# Patient Record
Sex: Female | Born: 1960 | Race: White | Hispanic: No | Marital: Married | Smoking: Never smoker
Health system: Southern US, Community
[De-identification: ages and names within clinical notes are randomized; demographics above are authoritative.]

## PROBLEM LIST (undated history)

## (undated) DIAGNOSIS — IMO0001 Reserved for inherently not codable concepts without codable children: Secondary | ICD-10-CM

## (undated) DIAGNOSIS — Z8719 Personal history of other diseases of the digestive system: Secondary | ICD-10-CM

## (undated) DIAGNOSIS — K219 Gastro-esophageal reflux disease without esophagitis: Secondary | ICD-10-CM

## (undated) DIAGNOSIS — F53 Postpartum depression: Secondary | ICD-10-CM

## (undated) DIAGNOSIS — Z90710 Acquired absence of both cervix and uterus: Secondary | ICD-10-CM

## (undated) DIAGNOSIS — O99345 Other mental disorders complicating the puerperium: Secondary | ICD-10-CM

## (undated) DIAGNOSIS — G43909 Migraine, unspecified, not intractable, without status migrainosus: Secondary | ICD-10-CM

## (undated) DIAGNOSIS — Z9889 Other specified postprocedural states: Secondary | ICD-10-CM

## (undated) HISTORY — DX: Migraine, unspecified, not intractable, without status migrainosus: G43.909

## (undated) HISTORY — PX: ABDOMINAL HYSTERECTOMY: SHX81

## (undated) HISTORY — DX: Gastro-esophageal reflux disease without esophagitis: K21.9

## (undated) HISTORY — DX: Other specified postprocedural states: Z98.890

## (undated) HISTORY — DX: Personal history of other diseases of the digestive system: Z87.19

## (undated) HISTORY — PX: EYE SURGERY: SHX253

## (undated) HISTORY — PX: OTHER SURGICAL HISTORY: SHX169

## (undated) HISTORY — PX: HERNIA REPAIR: SHX51

## (undated) HISTORY — DX: Postpartum depression: F53.0

## (undated) HISTORY — DX: Acquired absence of both cervix and uterus: Z90.710

## (undated) HISTORY — DX: Other mental disorders complicating the puerperium: O99.345

## (undated) HISTORY — DX: Reserved for inherently not codable concepts without codable children: IMO0001

---

## 2000-09-28 ENCOUNTER — Emergency Department (HOSPITAL_COMMUNITY): Admission: EM | Admit: 2000-09-28 | Discharge: 2000-09-28 | Payer: Self-pay | Admitting: Emergency Medicine

## 2000-09-28 ENCOUNTER — Encounter: Payer: Self-pay | Admitting: Emergency Medicine

## 2000-11-19 ENCOUNTER — Other Ambulatory Visit: Admission: RE | Admit: 2000-11-19 | Discharge: 2000-11-19 | Payer: Self-pay | Admitting: Family Medicine

## 2004-03-17 HISTORY — PX: UMBILICAL HERNIA REPAIR: SHX196

## 2004-09-11 ENCOUNTER — Other Ambulatory Visit: Admission: RE | Admit: 2004-09-11 | Discharge: 2004-09-11 | Payer: Self-pay | Admitting: Obstetrics and Gynecology

## 2005-02-21 ENCOUNTER — Ambulatory Visit (HOSPITAL_COMMUNITY): Admission: RE | Admit: 2005-02-21 | Discharge: 2005-02-21 | Payer: Self-pay

## 2005-02-21 ENCOUNTER — Ambulatory Visit (HOSPITAL_BASED_OUTPATIENT_CLINIC_OR_DEPARTMENT_OTHER): Admission: RE | Admit: 2005-02-21 | Discharge: 2005-02-21 | Payer: Self-pay

## 2005-02-21 ENCOUNTER — Encounter (INDEPENDENT_AMBULATORY_CARE_PROVIDER_SITE_OTHER): Payer: Self-pay | Admitting: Specialist

## 2005-03-17 DIAGNOSIS — Z8719 Personal history of other diseases of the digestive system: Secondary | ICD-10-CM

## 2005-03-17 HISTORY — DX: Personal history of other diseases of the digestive system: Z87.19

## 2006-04-24 ENCOUNTER — Encounter: Admission: RE | Admit: 2006-04-24 | Discharge: 2006-04-24 | Payer: Self-pay | Admitting: Obstetrics and Gynecology

## 2007-05-03 ENCOUNTER — Encounter: Admission: RE | Admit: 2007-05-03 | Discharge: 2007-05-03 | Payer: Self-pay | Admitting: Obstetrics and Gynecology

## 2007-05-28 ENCOUNTER — Ambulatory Visit: Payer: Self-pay | Admitting: Family Medicine

## 2007-05-28 DIAGNOSIS — F325 Major depressive disorder, single episode, in full remission: Secondary | ICD-10-CM | POA: Insufficient documentation

## 2007-05-28 DIAGNOSIS — K219 Gastro-esophageal reflux disease without esophagitis: Secondary | ICD-10-CM | POA: Insufficient documentation

## 2007-05-28 DIAGNOSIS — F329 Major depressive disorder, single episode, unspecified: Secondary | ICD-10-CM

## 2007-05-28 DIAGNOSIS — G43009 Migraine without aura, not intractable, without status migrainosus: Secondary | ICD-10-CM

## 2007-08-18 ENCOUNTER — Ambulatory Visit: Payer: Self-pay | Admitting: Family Medicine

## 2007-08-20 LAB — CONVERTED CEMR LAB
AST: 15 units/L (ref 0–37)
Albumin: 3.7 g/dL (ref 3.5–5.2)
BUN: 8 mg/dL (ref 6–23)
Chloride: 114 meq/L — ABNORMAL HIGH (ref 96–112)
Cholesterol: 142 mg/dL (ref 0–200)
Creatinine, Ser: 0.8 mg/dL (ref 0.4–1.2)
GFR calc Af Amer: 100 mL/min
GFR calc non Af Amer: 82 mL/min
LDL Cholesterol: 86 mg/dL (ref 0–99)
Total Protein: 6.3 g/dL (ref 6.0–8.3)
Triglycerides: 46 mg/dL (ref 0–149)
VLDL: 9 mg/dL (ref 0–40)

## 2007-08-23 ENCOUNTER — Ambulatory Visit: Payer: Self-pay | Admitting: Family Medicine

## 2007-09-07 ENCOUNTER — Ambulatory Visit: Payer: Self-pay | Admitting: Family Medicine

## 2007-09-07 LAB — CONVERTED CEMR LAB: Vit D, 1,25-Dihydroxy: 38 (ref 30–89)

## 2007-09-16 LAB — CONVERTED CEMR LAB
CO2: 27 meq/L (ref 19–32)
Calcium: 9.3 mg/dL (ref 8.4–10.5)
GFR calc non Af Amer: 96 mL/min
Sodium: 139 meq/L (ref 135–145)

## 2008-02-15 ENCOUNTER — Ambulatory Visit: Payer: Self-pay | Admitting: Family Medicine

## 2008-02-18 ENCOUNTER — Encounter: Payer: Self-pay | Admitting: Family Medicine

## 2008-02-21 LAB — CONVERTED CEMR LAB
ALT: 10 units/L (ref 0–35)
AST: 16 units/L (ref 0–37)
Albumin: 3.6 g/dL (ref 3.5–5.2)
Alkaline Phosphatase: 38 units/L — ABNORMAL LOW (ref 39–117)
BUN: 9 mg/dL (ref 6–23)
Bilirubin, Direct: 0.1 mg/dL (ref 0.0–0.3)
CO2: 30 meq/L (ref 19–32)
Chloride: 105 meq/L (ref 96–112)
Eosinophils Relative: 1.3 % (ref 0.0–5.0)
Glucose, Bld: 78 mg/dL (ref 70–99)
HCT: 33.7 % — ABNORMAL LOW (ref 36.0–46.0)
Lymphocytes Relative: 19.3 % (ref 12.0–46.0)
Monocytes Absolute: 0.4 10*3/uL (ref 0.1–1.0)
Monocytes Relative: 5.8 % (ref 3.0–12.0)
Neutrophils Relative %: 73.5 % (ref 43.0–77.0)
Platelets: 303 10*3/uL (ref 150–400)
Potassium: 3.6 meq/L (ref 3.5–5.1)
Total Protein: 6.7 g/dL (ref 6.0–8.3)
WBC: 6.1 10*3/uL (ref 4.5–10.5)

## 2008-12-06 ENCOUNTER — Ambulatory Visit: Payer: Self-pay | Admitting: Family Medicine

## 2008-12-08 ENCOUNTER — Telehealth: Payer: Self-pay | Admitting: Family Medicine

## 2008-12-11 ENCOUNTER — Ambulatory Visit: Payer: Self-pay | Admitting: Family Medicine

## 2009-07-19 ENCOUNTER — Ambulatory Visit: Payer: Self-pay | Admitting: Family Medicine

## 2009-07-19 DIAGNOSIS — R252 Cramp and spasm: Secondary | ICD-10-CM | POA: Insufficient documentation

## 2009-07-19 DIAGNOSIS — G4700 Insomnia, unspecified: Secondary | ICD-10-CM

## 2009-07-20 LAB — CONVERTED CEMR LAB
Chloride: 106 meq/L (ref 96–112)
Creatinine, Ser: 0.6 mg/dL (ref 0.4–1.2)
Iron: 11 ug/dL — ABNORMAL LOW (ref 42–145)
Potassium: 4.1 meq/L (ref 3.5–5.1)
Saturation Ratios: 2.7 % — ABNORMAL LOW (ref 20.0–50.0)
Transferrin: 288.1 mg/dL (ref 212.0–360.0)

## 2009-11-20 ENCOUNTER — Ambulatory Visit: Payer: Self-pay | Admitting: Family Medicine

## 2009-11-20 ENCOUNTER — Telehealth (INDEPENDENT_AMBULATORY_CARE_PROVIDER_SITE_OTHER): Payer: Self-pay | Admitting: *Deleted

## 2009-11-20 LAB — CONVERTED CEMR LAB
AST: 14 units/L (ref 0–37)
Alkaline Phosphatase: 45 units/L (ref 39–117)
Basophils Absolute: 0 10*3/uL (ref 0.0–0.1)
Bilirubin, Direct: 0.1 mg/dL (ref 0.0–0.3)
Calcium: 8.6 mg/dL (ref 8.4–10.5)
Eosinophils Relative: 1.7 % (ref 0.0–5.0)
Ferritin: 3.2 ng/mL — ABNORMAL LOW (ref 10.0–291.0)
GFR calc non Af Amer: 118.19 mL/min (ref >60)
Glucose, Bld: 91 mg/dL (ref 70–99)
HCT: 32.7 % — ABNORMAL LOW (ref 36.0–46.0)
HDL: 43.9 mg/dL (ref >39.00)
Iron: 22 ug/dL — ABNORMAL LOW (ref 42–145)
LDL Cholesterol: 119 mg/dL — ABNORMAL HIGH (ref 0–99)
Lymphs Abs: 1.2 10*3/uL (ref 0.7–4.0)
MCV: 77.3 fL — ABNORMAL LOW (ref 78.0–100.0)
Monocytes Absolute: 0.4 10*3/uL (ref 0.1–1.0)
Neutrophils Relative %: 71.1 % (ref 43.0–77.0)
Platelets: 329 10*3/uL (ref 150.0–400.0)
Potassium: 4.3 meq/L (ref 3.5–5.1)
RDW: 16.9 % — ABNORMAL HIGH (ref 11.5–14.6)
Sodium: 141 meq/L (ref 135–145)
Total Bilirubin: 0.4 mg/dL (ref 0.3–1.2)
Total CHOL/HDL Ratio: 4
Transferrin: 270.2 mg/dL (ref 212.0–360.0)
VLDL: 6.6 mg/dL (ref 0.0–40.0)
WBC: 6 10*3/uL (ref 4.5–10.5)

## 2009-11-23 ENCOUNTER — Other Ambulatory Visit: Admission: RE | Admit: 2009-11-23 | Discharge: 2009-11-23 | Payer: Self-pay | Admitting: Family Medicine

## 2009-11-23 ENCOUNTER — Ambulatory Visit: Payer: Self-pay | Admitting: Family Medicine

## 2009-11-23 DIAGNOSIS — R6882 Decreased libido: Secondary | ICD-10-CM | POA: Insufficient documentation

## 2009-11-23 DIAGNOSIS — D509 Iron deficiency anemia, unspecified: Secondary | ICD-10-CM | POA: Insufficient documentation

## 2009-11-23 DIAGNOSIS — N920 Excessive and frequent menstruation with regular cycle: Secondary | ICD-10-CM | POA: Insufficient documentation

## 2009-11-23 LAB — HM PAP SMEAR

## 2009-11-26 LAB — CONVERTED CEMR LAB
FSH: 9.4 milliintl units/mL
LH: 4.1 milliintl units/mL

## 2009-11-30 ENCOUNTER — Encounter (INDEPENDENT_AMBULATORY_CARE_PROVIDER_SITE_OTHER): Payer: Self-pay | Admitting: *Deleted

## 2009-12-05 ENCOUNTER — Encounter: Admission: RE | Admit: 2009-12-05 | Discharge: 2009-12-05 | Payer: Self-pay | Admitting: Family Medicine

## 2010-01-25 ENCOUNTER — Encounter: Admission: RE | Admit: 2010-01-25 | Discharge: 2010-01-25 | Payer: Self-pay | Admitting: Family Medicine

## 2010-01-25 DIAGNOSIS — D259 Leiomyoma of uterus, unspecified: Secondary | ICD-10-CM | POA: Insufficient documentation

## 2010-02-18 ENCOUNTER — Encounter: Payer: Self-pay | Admitting: Family Medicine

## 2010-04-16 NOTE — Letter (Signed)
Summary: Results Follow up Letter  [place] [facility]  [address] [place], [place] [address]   Phone: [phone]  Fax: [phone]    11/30/2009 MRN: [id number]     [facility] [address] [place], [place]  [address]    Dear Ms. Siebel,  The following are the results of your recent test(s):  Test         Result    Pap Smear:        Normal __x___  Not Normal _____ Comments:Repeat in 1 year ______________________________________________________ Cholesterol: LDL(Bad cholesterol):         Your goal is less than:         HDL (Good cholesterol):       Your goal is more than: Comments:  ______________________________________________________ Mammogram:        Normal _____  Not Normal _____ Comments:  ___________________________________________________________________ Hemoccult:        Normal _____  Not normal _______ Comments:    _____________________________________________________________________ Other Tests:    We routinely do not discuss normal results over the telephone.  If you desire a copy of the results, or you have any questions about this information we can discuss them at your next office visit.   Sincerely,   Kerby Nora MD

## 2010-04-16 NOTE — Assessment & Plan Note (Signed)
Summary: FATIGUE,LACK OF SLEEP/DLO   Vital Signs:  Patient profile:   50 year old female Weight:      144.50 pounds Temp:     98.4 degrees F oral Pulse rate:   76 / minute Pulse rhythm:   regular BP sitting:   112 / 68  (left arm) Cuff size:   regular  Vitals Entered By: Sydell Axon LPN (Jul 19, 1608 12:26 PM) CC: Insomnia for several months and leg cramps at night   History of Present Illness: "I cannot sleep." for several months. No unusual event and no real problems to preclude sleep. Just began having trouble and it has accelerated. Her mother was recently diagnose with thyroid disease. She has then developed leg cramps about 2-3 weeks ago,  no matter what  activity she has or has not done  in the day. She has not changfed her routine or her intake. She has a pretty set schedule and diet.  Problems Prior to Update: 1)  Cerumen Impaction, Right  (ICD-380.4) 2)  Vaginitis, Candidal  (ICD-112.1) 3)  Epigastric Pain  (ICD-789.06) 4)  Routine Gynecological Examination  (ICD-V72.31) 5)  Hypocalcemia  (ICD-275.41) 6)  Health Maintenance Exam  (ICD-V70.0) 7)  Screening For Lipoid Disorders  (ICD-V77.91) 8)  Common Migraine  (ICD-346.10) 9)  Gerd  (ICD-530.81) 10)  Depression  (ICD-311)  Medications Prior to Update: 1)  Acidophilus Probiotic Blend   Caps (Misc Intestinal Flora Regulat) .... Take 1 Tablet By Mouth Once A Day 2)  Prilosec Otc 20 Mg Tbec (Omeprazole Magnesium) .... One A Day 3)  Diflucan 150 Mg Tabs (Fluconazole) .Marland Kitchen.. 1 Tab By Mouth Every 72 Hours For 3 Doses  Allergies: 1)  ! * Penicillin 2)  ! * Macrodantin 3)  ! * Pyridium 4)  ! * Cipro 5)  ! * Septra  Physical Exam  General:  Well-developed,well-nourished,in no acute distress; alert,appropriate and cooperative throughout examination Ears:  left Tm clear...right TM occluded with wax, slight erythema of ear canal...after ear irrigation.Marland KitchenMarland KitchenTM appears clear and pts symptoms are resolved.  Nose:  External nasal  examination shows no deformity or inflammation. Nasal mucosa are pink and moist without lesions or exudates. Mouth:  MMM, no exudate, no oropharyngeal irritation Neck:  no carotid bruit or thyromegaly no cervical or supraclavicular lymphadenopathy  Lungs:  Normal respiratory effort, chest expands symmetrically. Lungs are clear to auscultation, no crackles or wheezes. Heart:  Normal rate and regular rhythm. S1 and S2 normal without gallop, murmur, click, rub or other extra sounds. Abdomen:  Bowel sounds positive,abdomen soft and non-tender without masses, organomegaly or hernias noted. Msk:  No deformity or scoliosis noted of thoracic or lumbar spine.   Extremities:  No clubbing, cyanosis, edema, or deformity noted with normal full range of motion of all joints.  No musc wasting noted. Neurologic:  No cranial nerve deficits noted. Station and gait are normal. DTRs are symmetrical throughout. Sensory, motor and coordinative functions appear intact. Skin:  Intact without suspicious lesions or rashes   Impression & Recommendations:  Problem # 1:  INSOMNIA (ICD-780.52) Assessment New  Discussed sleep hygiene and the approach of the latest journal article. Try to institute those she isn't already doing.  Try Melatonin 3 mg at bedtime, try for one week then taper. Do not use more than 3 mos at a time without medication holiday. Orders: TLB-TSH (Thyroid Stimulating Hormone) (84443-TSH)  Discussed sleep hygiene.   Problem # 2:  LEG CRAMPS, NOCTURNAL (ICD-729.82) Assessment: New Try Jogging  in a Jug, as directed. Will check labs. Orders: TLB-TSH (Thyroid Stimulating Hormone) (84443-TSH) TLB-B12 + Folate Pnl (16109_60454-U98/JXB) TLB-IBC Pnl (Iron/FE;Transferrin) (83550-IBC) TLB-BMP (Basic Metabolic Panel-BMET) (80048-METABOL)  Complete Medication List: 1)  Ibuprofen 200 Mg Tabs (Ibuprofen) .... As needed 2)  Excedrin Migraine 250-250-65 Mg Tabs (Aspirin-acetaminophen-caffeine) .... As  needed  Patient Instructions: 1)  Try Melatonin, 3mg  at night for one week and then slowly taper. 2)  Use sleep cycle hygiene. 3)  Google Jogging in a jug and try regularly for cramping  Current Allergies (reviewed today): ! * PENICILLIN ! * MACRODANTIN ! * PYRIDIUM ! * CIPRO ! * SEPTRA

## 2010-04-16 NOTE — Progress Notes (Signed)
----   Converted from flag ---- ---- 11/17/2009 11:43 PM, Kerby Nora MD wrote: total iron, ferritin, transferrin, cbc, CMET, lipids Dx 729.82, v77.91  ---- 11/15/2009 10:27 AM, Liane Comber CMA (AAMA) wrote: Peri Jefferson Morning! This pt is scheduled for cpx labs Tuesday, which labs to draw and dx codes to use? Thanks Tasha ------------------------------

## 2010-04-16 NOTE — Assessment & Plan Note (Signed)
Summary: CPX/CLE   Vital Signs:  Patient profile:   50 year old female Height:      65 inches Weight:      143 pounds BMI:     23.88 Temp:     99.3 degrees F oral Pulse rate:   88 / minute Pulse rhythm:   regular BP sitting:   110 / 68  (left arm) Cuff size:   regular  Vitals Entered By: Lowella Petties CMA (November 23, 2009 3:00 PM) CC: Check up   History of Present Illness: The patient is here for annual wellness exam and preventative care.    She has the below other ongoing issues  Insomnia...improved with supplements, melatonin  initially..then stopped..now when restarted thing..no effect.   Anemia, poor control: Likely due to DUB  Iron deficiency causing leg cramps/restless legs...improved some but remains low on iron 325 mg daily  Leg cramps have improved.   Menses are very  heavy... 3-4 days changing pads every 2 hours  Mothers family went through early menopause..mother had surgical menopause.  As mammogram scheduled upcoming.   Problems Prior to Update: 1)  Libido, Decreased  (ICD-799.81) 2)  Anemia, Iron Deficiency  (ICD-280.9) 3)  Menorrhagia  (ICD-626.2) 4)  Leg Cramps, Nocturnal  (ICD-729.82) 5)  Insomnia  (ICD-780.52) 6)  Routine Gynecological Examination  (ICD-V72.31) 7)  Health Maintenance Exam  (ICD-V70.0) 8)  Screening For Lipoid Disorders  (ICD-V77.91) 9)  Common Migraine  (ICD-346.10) 10)  Gerd  (ICD-530.81) 11)  Depression  (ICD-311)  Current Medications (verified): 1)  Ibuprofen 200 Mg Tabs (Ibuprofen) .... As Needed 2)  Excedrin Migraine 250-250-65 Mg Tabs (Aspirin-Acetaminophen-Caffeine) .... As Needed  Allergies (verified): 1)  ! * Penicillin 2)  ! * Macrodantin 3)  ! * Pyridium 4)  ! * Cipro 5)  ! * Septra  Past History:  Past medical, surgical, family and social histories (including risk factors) reviewed, and no changes noted (except as noted below).  Past Medical History: Reviewed history from 05/28/2007 and no changes  required. 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 Umbilical hernia in 2006 (no complications) Vaginal births 65 and 1998  Family History: Reviewed history from 05/28/2007 and no changes required. Father: Alive, MI at 7, tripple bypass surgery, hypercholesteremia Mother: Alive, fibromyalgia, HTN, hypercholesteremia, osteoporosis Brother: Alive, healthy  Children: 2 sons (youngest with asthma and seasonal allergies)  Social History: Reviewed history from 05/28/2007 and no changes required. Married (25 years), husband is healthy. 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    Never smoked. No alcohol or drug use.  Exercise 1-2 times a week (10-30 minutes) and outdoor activities in the spring and summer.   Healthy diet with fruits and vegetables.    Review of Systems General:  Denies fatigue and fever. CV:  Denies chest pain or discomfort. Resp:  Denies shortness of breath. GI:  Denies abdominal pain. GU:  Denies dysuria. Psych:  Denies anxiety and depression; decreased sex drive hot alot but no hot flashes.  Physical Exam  General:  Well-developed,well-nourished,in no acute distress; alert,appropriate and cooperative throughout examination Ears:  External ear exam shows no significant lesions or deformities.  Otoscopic examination reveals clear canals, tympanic membranes are intact bilaterally without bulging, retraction, inflammation or discharge. Hearing is grossly normal bilaterally. Nose:  External nasal examination shows no deformity or inflammation. Nasal mucosa are pink and moist without lesions  or exudates. Mouth:  Oral mucosa and oropharynx without lesions or exudates.  Teeth in good repair. Neck:  no carotid bruit or thyromegaly no cervical or supraclavicular lymphadenopathy  Lungs:  Normal  respiratory effort, chest expands symmetrically. Lungs are clear to auscultation, no crackles or wheezes. Heart:  Normal rate and regular rhythm. S1 and S2 normal without gallop, murmur, click, rub or other extra sounds. Abdomen:  Bowel sounds positive,abdomen soft and non-tender without masses, organomegaly or hernias noted. Genitalia:  Pelvic Exam:        External: normal female genitalia without lesions or masses        Vagina: normal without lesions or masses        Cervix: normal without lesions or masses        Adnexa: normal bimanual exam without masses or fullness        Uterus: fullness..? fibroid, small        Pap smear: performed Msk:  No deformity or scoliosis noted of thoracic or lumbar spine.   Pulses:  R and L posterior tibial pulses are full and equal bilaterally  Extremities:  no edema Skin:  Intact without suspicious lesions or rashes Psych:  Cognition and judgment appear intact. Alert and cooperative with normal attention span and concentration. No apparent delusions, illusions, hallucinations   Impression & Recommendations:  Problem # 1:  HEALTH MAINTENANCE EXAM (ICD-V70.0) The patient's preventative maintenance and recommended screening tests for an annual wellness exam were reviewed in full today. Brought up to date unless services declined.  Counselled on the importance of diet, exercise, and its role in overall health and mortality. The patient's FH and SH was reviewed, including their home life, tobacco status, and drug and alcohol status.     Problem # 2:  ROUTINE GYNECOLOGICAL EXAMINATION (ICD-V72.31) PAP pending  Problem # 3:  MENORRHAGIA (ICD-626.2) ? due to perimenopause. ? small fibroid palpated. Eval further with US pelvic. Eval FSH and LH...no bleeding issue such as low paltelets seen on cbc. Nml TSH. May need hormonal treatment versus GYN referral.  Orders: Radiology Referral (Radiology) Specimen Handling (52841) T-FSH [phone]) T-LH  [phone])  Problem # 4:  LEG CRAMPS, NOCTURNAL (ICD-729.82) resolved.   Problem # 5:  ANEMIA, IRON DEFICIENCY (ICD-280.9) Due to DUB.  Restart daily to two times a day iron . Her updated medication list for this problem includes:    Ferrous Sulfate 325 (65 Fe) Mg Tbec (Ferrous sulfate) .Marland Kitchen... Take 1 tablet by mouth once a day  Problem # 6:  INSOMNIA (ICD-780.52)  Melatonin not effective second time.  ? due to perimenopause Reviewed sleep hygeine. Trial of ambien..helped her in the past.  Her updated medication list for this problem includes:    Zolpidem Tartrate 10 Mg Tabs (Zolpidem tartrate) .Marland Kitchen... 1/2 to 1 tab by mouth at bedtime as needed insomnia  Problem # 7:  LIBIDO, DECREASED (ICD-799.81) Denies depression. ? due to perimenopause  Problem # 8:  COMMON MIGRAINE (ICD-346.10) Sumatriptan trial for seve migraine...use OTC meds for mild to moderate. Occuring once a monthwith menses.  Her updated medication list for this problem includes:    Ibuprofen 200 Mg Tabs (Ibuprofen) .Marland Kitchen... As needed    Excedrin Migraine 250-250-65 Mg Tabs (Aspirin-acetaminophen-caffeine) .Marland Kitchen... As needed    Sumatriptan Succinate 100 Mg Tabs (Sumatriptan succinate) .Marland Kitchen... 1 tab by mouth x 1 may repeat in 2 hours if not relief. max 200 mg in 24 hours.  Complete Medication List: 1)  Ibuprofen 200 Mg Tabs (Ibuprofen) .Marland KitchenMarland KitchenMarland Kitchen  As needed 2)  Excedrin Migraine 250-250-65 Mg Tabs (Aspirin-acetaminophen-caffeine) .... As needed 3)  Ferrous Sulfate 325 (65 Fe) Mg Tbec (Ferrous sulfate) .... Take 1 tablet by mouth once a day 4)  Sumatriptan Succinate 100 Mg Tabs (Sumatriptan succinate) .Marland Kitchen.. 1 tab by mouth x 1 may repeat in 2 hours if not relief. max 200 mg in 24 hours. 5)  Zolpidem Tartrate 10 Mg Tabs (Zolpidem tartrate) .... 1/2 to 1 tab by mouth at bedtime as needed insomnia  Other Orders: Admin 1st Vaccine (16109) Flu Vaccine 55yrs + (60454)  Patient Instructions: 1)  Referral Appointment Information 2)   Day/Date: 3)  Time: 4)  Place/MD: 5)  Address: 6)  Phone/Fax: 7)  Patient given appointment information. Information/Orders faxed/mailed.  8)  Retart iron tablet.  9)  Increase exercise further. 10)  Try sumatriptan for migraines, severe. 11)  Limit ambien use but may use for insomnia ..taper use. Not long term med.  69)  We will call with lab and [place] results.  Prescriptions: ZOLPIDEM TARTRATE 10 MG TABS (ZOLPIDEM TARTRATE) 1/2 to 1 tab by mouth at bedtime as needed insomnia  #30 x 0   Entered and Authorized by:   Kerby Nora MD   Signed by:   Kerby Nora MD on 11/23/2009   Method used:   Print then Give to Patient   RxID:   [id number] SUMATRIPTAN SUCCINATE 100 MG TABS (SUMATRIPTAN SUCCINATE) 1 tab by mouth x 1 may repeat in 2 hours if not relief. MAx 200 mg in 24 hours.  #9 x 0   Entered and Authorized by:   Kerby Nora MD   Signed by:   Kerby Nora MD on 11/23/2009   Method used:   Electronically to        [facility]  [address] [phone]* (retail)       [address]       [place], [place]  [address]       Ph: [phone]       Fax: [phone]   RxID:   [id number]   Prior Medications (reviewed today): IBUPROFEN 200 MG TABS (IBUPROFEN) as needed EXCEDRIN MIGRAINE 250-250-65 MG TABS (ASPIRIN-ACETAMINOPHEN-CAFFEINE) as needed Current Allergies (reviewed today): ! * PENICILLIN ! * MACRODANTIN ! * PYRIDIUM ! * CIPRO ! * SEPTRA  Flu Vaccine Consent Questions     Do you have a history of severe allergic reactions to this vaccine? no    Any prior history of allergic reactions to egg and/or gelatin? no    Do you have a sensitivity to the preservative Thimersol? no    Do you have a past history of Guillan-Barre Syndrome? no    Do you currently have an acute febrile illness? no    Have you ever had a severe reaction to latex? no    Vaccine information given and explained to patient? yes    Are you currently pregnant? no    Lot Number:[id number]    Exp Date:09/14/2010   Site Given  Left Deltoid IM Last Flu Vaccine:  Historical (12/16/2006 10:50:31 AM) Flu Vaccine Result Date:  11/16/2009 Flu Vaccine Result:  given Flu Vaccine Next Due:  1 yr   .lbflu

## 2010-04-18 NOTE — Consult Note (Signed)
Summary: [facility]   Imported By: Lanelle Bal 03/02/2010 09:59:49  _____________________________________________________________________  External Attachment:    Type:   Image     Comment:   External Document

## 2010-04-24 ENCOUNTER — Encounter (HOSPITAL_COMMUNITY)
Admission: RE | Admit: 2010-04-24 | Discharge: 2010-04-24 | Disposition: A | Discharge status: Home / Self Care | Payer: 59 | Source: Ambulatory Visit | Attending: Obstetrics and Gynecology | Admitting: Obstetrics and Gynecology

## 2010-04-24 DIAGNOSIS — Z01812 Encounter for preprocedural laboratory examination: Secondary | ICD-10-CM | POA: Insufficient documentation

## 2010-04-24 LAB — COMPREHENSIVE METABOLIC PANEL
Albumin: 3.8 g/dL (ref 3.5–5.2)
BUN: 6 mg/dL (ref 6–23)
Calcium: 8.8 mg/dL (ref 8.4–10.5)
Chloride: 105 mEq/L (ref 96–112)
Creatinine, Ser: 0.59 mg/dL (ref 0.4–1.2)
GFR calc Af Amer: >60 mL/min (ref >60)
Total Bilirubin: 0.5 mg/dL (ref 0.3–1.2)

## 2010-04-24 LAB — CBC
MCH: 25.1 pg — ABNORMAL LOW (ref 26.0–34.0)
MCHC: 31.1 g/dL (ref 30.0–36.0)
MCV: 80.7 fL (ref 78.0–100.0)
Platelets: 299 10*3/uL (ref 150–400)
RBC: 4.46 MIL/uL (ref 3.87–5.11)
RDW: 16.1 % — ABNORMAL HIGH (ref 11.5–15.5)

## 2010-04-24 LAB — SURGICAL PCR SCREEN: MRSA, PCR: NEGATIVE

## 2010-04-30 ENCOUNTER — Ambulatory Visit (HOSPITAL_COMMUNITY)
Admission: RE | Admit: 2010-04-30 | Discharge: 2010-05-01 | Disposition: A | Discharge status: Home / Self Care | Payer: 59 | Source: Ambulatory Visit | Attending: Obstetrics and Gynecology | Admitting: Obstetrics and Gynecology

## 2010-04-30 ENCOUNTER — Other Ambulatory Visit: Payer: Self-pay | Admitting: Obstetrics and Gynecology

## 2010-04-30 DIAGNOSIS — N840 Polyp of corpus uteri: Secondary | ICD-10-CM | POA: Insufficient documentation

## 2010-04-30 DIAGNOSIS — N80109 Endometriosis of ovary, unspecified side, unspecified depth: Secondary | ICD-10-CM | POA: Insufficient documentation

## 2010-04-30 DIAGNOSIS — N801 Endometriosis of ovary: Secondary | ICD-10-CM | POA: Insufficient documentation

## 2010-04-30 DIAGNOSIS — D250 Submucous leiomyoma of uterus: Secondary | ICD-10-CM | POA: Insufficient documentation

## 2010-04-30 DIAGNOSIS — N949 Unspecified condition associated with female genital organs and menstrual cycle: Secondary | ICD-10-CM | POA: Insufficient documentation

## 2010-04-30 DIAGNOSIS — D251 Intramural leiomyoma of uterus: Secondary | ICD-10-CM | POA: Insufficient documentation

## 2010-04-30 DIAGNOSIS — N83209 Unspecified ovarian cyst, unspecified side: Secondary | ICD-10-CM | POA: Insufficient documentation

## 2010-04-30 DIAGNOSIS — N938 Other specified abnormal uterine and vaginal bleeding: Secondary | ICD-10-CM | POA: Insufficient documentation

## 2010-04-30 NOTE — H&P (Addendum)
  NAMEBRILYNN, [name]                ACCOUNT NO.:  [id number]  MEDICAL RECORD NO.:  [id number]          PATIENT TYPE:  AMB  LOCATION:  SDC                           FACILITY:  [facility]  PHYSICIAN:  Dineen Kid. Rana Snare, M.D.    DATE OF BIRTH:  1960-04-05  DATE OF ADMISSION:  04/30/2010 DATE OF DISCHARGE:                             HISTORY & PHYSICAL   She is to be admitted tomorrow, April 30, 2010, to [facility] for hysterectomy.  HISTORY OF PRESENT ILLNESS:  Ms. Connie [name] is a 50 year old G2, P2 who has been having worsening problems with vaginal bleeding and pelvic pressure.  Ultrasound evaluation showed multiple fibroids including intracavitary fibroid.  She also has a right ovarian mass suspicious for an endometrioma.  She has no further childbearing desires.  Husband has had a vasectomy.  She presents for definitive surgical intervention and requests hysterectomy, also planned to remove the right tube and ovary.  PAST MEDICAL HISTORY:  Significant for migraines.  PAST SURGICAL HISTORY:  She has had 2 vaginal births and an umbilical hernia repair.  She has allergies to PENICILLIN, CIPRO, SEPTRA, MACRODANTIN, and PYRIDIUM, however, she can take cephalosporins.  MEDICATIONS:  She takes Imitrex 50 mg as needed and Ambien 5-10 mg as needed at bedtime for sleep.  PHYSICAL EXAMINATION:  VITAL SIGNS:  Her blood pressure 110/70, weight is 140 pounds. HEART:  Regular rate and rhythm. LUNGS:  Clear to auscultation bilaterally. ABDOMEN:  Soft, distended, nontender. PELVIC:  Uterus is anteverted, mobile, 8-10-week size.  Mass in the right adnexa, measuring approximately 5-6 cm in size.  IMPRESSION AND PLAN:  Menorrhagia, fibroids, pelvic pain, right ovarian complex cyst, suspicion for endometrioma.  Planned hysterectomy.  We are going to attempt laparoscopic-assisted vaginal hysterectomy with removal of right tube and ovary.  She does wish preservation of the left and we discussed  possibility having removed that.  She does give informed consent to remove the left ovary as a secondary needs to be removed. Discussed the possibility of laparoscopically assisted hysterectomy versus laparotomy because of her previous hernia repair.  We have to evaluate that with a 5-mm scope, looking upwards at the umbilical site to see whether or not we can proceed through the umbilicus.  If it appears there is too much scar tissue, if it is not appeared safe to proceed laparoscopically, we will do this by laparotomy.  I discussed the risks and benefits of procedure at length which include but not limited to risk of infection, bleeding, damage to uterus, tubes, and bowel and bladder, risks associated blood transfusion, risks associated with anesthesia.  She does give her informed consent and wished to proceed.     Dineen Kid Rana Snare, M.D.     DCL/MEDQ  D:  04/29/2010  T:  04/30/2010  Job:  [id number]  Electronically Signed by Candice Camp M.D. on 04/30/2010 01:01:30 PM

## 2010-05-01 LAB — CBC
HCT: 25.6 % — ABNORMAL LOW (ref 36.0–46.0)
Platelets: 282 10*3/uL (ref 150–400)
RBC: 3.1 MIL/uL — ABNORMAL LOW (ref 3.87–5.11)
RDW: 16.1 % — ABNORMAL HIGH (ref 11.5–15.5)
WBC: 10.8 10*3/uL — ABNORMAL HIGH (ref 4.0–10.5)

## 2010-05-13 NOTE — Discharge Summary (Addendum)
  Connie [name], Connie [name]                ACCOUNT NO.:  [id number]  MEDICAL RECORD NO.:  [id number]           PATIENT TYPE:  LOCATION:                                 FACILITY:  PHYSICIAN:  Dineen Kid. Rana Snare, M.D.    DATE OF BIRTH:  [birth year]  DATE OF ADMISSION:  04/30/2010 DATE OF DISCHARGE:  05/01/2010                              DISCHARGE SUMMARY   HISTORY OF PRESENT ILLNESS:  Connie [name] is a 50 year old G2 P2 with worsening problems with pelvic pressure, abnormal uterine bleeding, fibroids, and right ovarian cyst which is suspicious for endometrioma. She presented for definitive surgical intervention, planned laparoscopic- assisted vaginal hysterectomy with right salpingo-oophorectomy.  HOSPITAL COURSE:  The patient underwent a laparoscopic-assisted vaginal hysterectomy and salpingo-oophorectomy.  The procedure was uncomplicated.  Estimated blood loss was 300 mL.  Findings at the time of surgery were right ovarian cyst which was consistent with endometrioma and enlarged 8-10 week size uterus consistent with fibroids.  Her postoperative care was relatively unremarkable.  She had good return of bowel function and ambulation.  Her postop hemoglobin on postop day #1 was 8.0 but she was able to tolerate a regular diet without difficulty.  The pain was well controlled on ibuprofen and oxycodone.  Incisions were clean, dry, and intact with normoactive bowel sounds.  The patient desired discharge  to home.  On postop day #1, she remained afebrile throughout her hospital course albeit having both blood pressures in the 80s to 90s over 50s but normal pulse in the 70s to 90s.  DISPOSITION:  The patient will be discharged home.  Follow up in the office in 2 weeks and with routine instruction sheet for hysterectomy, told to return for increased pain, fever, or bleeding.  She is going to restart her on iron therapy for anemia and she has prescription for oxycodone.     Dineen Kid Rana Snare,  M.D.     DCL/MEDQ  D:  05/01/2010  T:  05/01/2010  Job:  [id number]  Electronically Signed by Candice Camp M.D. on 05/13/2010 08:06:11 AM

## 2010-05-13 NOTE — Op Note (Addendum)
Connie [name], [name]                ACCOUNT NO.:  [id number]  MEDICAL RECORD NO.:  [id number]           PATIENT TYPE:  I  LOCATION:  9318                          FACILITY:  [facility]  PHYSICIAN:  Dineen Kid. Rana Snare, M.D.    DATE OF BIRTH:  [birth year]  DATE OF PROCEDURE:  04/30/2010 DATE OF DISCHARGE:                              OPERATIVE REPORT   PREOPERATIVE DIAGNOSES:  Pelvic pain, abnormal uterine bleeding, uterine leiomyoma and right ovarian cyst.  POSTOPERATIVE DIAGNOSES:  Pelvic pain, abnormal uterine bleeding, uterine leiomyoma and right ovarian cyst.  PROCEDURE:  Laparoscopic-assisted vaginal hysterectomy and right salpingo-oophorectomy.  SURGEON:  Dineen Kid. Rana Snare, MD  ASSISTANT:  Freddy Finner, MD  ANESTHESIA:  General endotracheal.  INDICATIONS:  Connie [name] is a 50 year old G2, P2 with worsening pelvic pressure, right ovarian cyst consistent with endometrioma approximately 5.5-cm in size, uterine fibroids with abnormal bleeding excessive in her menstrual.  She desires to definitive surgical intervention and requests hysterectomy, presents for LAVH and RSO.  The risks and benefits of procedure were discussed at length, which include but are not limited to risk of infection, bleeding, damage to bowel, bladder, ureters, ovary. Risks associated with anesthesia.  Risks associated with blood transfusion.  She gives her informed consent and wished to proceed.  FINDINGS AT THE TIME OF SURGERY:  An enlarged 10-week size uterus in retroverted position consistent with fibroids, right ovarian cyst consistent with endometrioma, left ovary and tube appeared to be normal. The appendix was not visualized due to be on retrocecal location, normal- appearing gallbladder and liver.  The previous umbilical herniorrhaphy was without evidence of adhesions.  DESCRIPTION OF PROCEDURE:  After adequate analgesia, the patient was placed in the dorsal lithotomy position.  She was sterilely prepped  and draped.  The bladder was sterilely drained.  Hulka tenaculum was placed on the cervix.  A 5-mm incision was made to the left of midline two fingerbreadths above the pubic symphysis.  A Veress needle was inserted. The abdomen was insufflated with dullness to percussion.  A 5-mm trocar was inserted.  A 5-mm camera was inserted looking at the previous umbilical incision.  No evidence of adhesions were noted.  A 1-cm infraumbilical skin incision was then made through the umbilicus.  The trocar was inserted.  Reexamination revealed good placement noted.  The abdomen was reinsufflated.  The legs were repositioned and after careful systematic evaluation of the abdomen and pelvis, the gyrus cutting forceps were used to dissect along the left broad ligament, ligating and dissecting across the round ligament.  The utero-ovarian ligament was down to the inferior portion of the broad ligament with the left tube and ovary falling laterally.  Good hemostasis was achieved and carried to avoid the underlying ureter.  The right ovarian mass was pulled to the midline and the infundibulopelvic ligament was ligated with gyrus and dissected across the infundibulopelvic ligament across the rest of the broad ligament and the round ligament with the right tube and ovary falling to the midline.  Good hemostasis had been achieved.  The bladder was then elevated.  The uterovesical junction was noted and  a bladder flap was created.  The abdomen was then desufflated.  The legs were repositioned.  A weighted speculum was placed in the vagina.  Posterior colpotomy was then performed.  The cervix was then circumscribed with Bovie cautery.  LigaSure instrument was used to ligate across the uterosacral ligaments bilaterally with the uterine vessels bilaterally with the cardinal ligaments bilaterally and the bladder pillars.  The anterior vaginal mucosa was freed from the anterior cervical fascia, dissected anteriorly  until the peritoneum was entered on the anterior surface with the Deaver retractor was placed underneath the bladder. LigaSure instrument was then used to ligate across the uterine vasculature bilaterally and the inferior portions of the broad ligaments with good hemostasis achieved.  The uterus was then removed with the right tube and ovary intact.  A small packing was placed.  The uterosacral ligaments were identified, ligated with suture of 0 Monocryl suture.  The posterior peritoneum was then closed in pursestring fashion with 0 Monocryl suture.  The vagina was then closed in a vertical fashion using figure-of-eights of 0 Monocryl suture with good approximation noted.  The packing was then removed and the remaining portion of the vagina was then closed with good support noted.  The uterosacral ligaments were plicated in the midline.  Good support and good hemostasis was noted.  The bladder was then drained with a Foley catheter with clear yellow urine noted.  The legs were repositioned. The abdomen was reinsufflated.  Nezhat suction irrigator was used to irrigate the abdomen and pelvis.  Small peritoneal bleeders were coagulated with bipolar cautery and careful systematic evaluation of the right infundibulopelvic ligament revealed good hemostasis.  Good peristalsis to the ureter both on the right and left.  The left utero- ovarian ligament was good hemostasis.  The cul-de-sac similarly was hemostatic.  The abdomen was then desufflated.  Trocars were removed. The infraumbilical skin incision was closed with a 0 Ethibond permanent suture in the fascia and mesh with good approximation noted.  The umbilical skin incision was then closed with two interrupted sutures of 3-0 Vicryl Rapide.  The 5-mm site was closed with 3-0 Vicryl Rapide subcuticular suture, and Dermabond.  The incisions were injected with 0.25% Marcaine, total of 10 mL used.  The patient stable and transferred to the  recovery room.  Sponge and instrument count was normal x3. Estimated blood loss 300 mL.  The patient received 1 g of cefotetan preoperatively.     Dineen Kid Rana Snare, M.D.     DCL/MEDQ  D:  04/30/2010  T:  05/01/2010  Job:  [id number]  Electronically Signed by Candice Camp M.D. on 05/13/2010 08:06:13 AM

## 2010-08-02 ENCOUNTER — Telehealth: Payer: Self-pay | Admitting: *Deleted

## 2010-08-02 NOTE — Op Note (Signed)
Connie [name], Connie [name]                ACCOUNT NO.:  [id number]   MEDICAL RECORD NO.:  [id number]          PATIENT TYPE:  AMB   LOCATION:  [facility]                         FACILITY:  [facility]   PHYSICIAN:  Lebron Conners, M.D.   DATE OF BIRTH:  1960-10-17   DATE OF PROCEDURE:  02/21/2005  DATE OF DISCHARGE:                                 OPERATIVE REPORT   PREOPERATIVE DIAGNOSIS:  Umbilical hernia with umbilical mass.   POSTOPERATIVE DIAGNOSIS:  Umbilical hernia with umbilical mass.   OPERATION:  Repair of umbilical hernia and biopsy of umbilical mass.   SURGEON:  Lebron Conners, M.D.   ANESTHESIA:  General and local.   DESCRIPTION OF PROCEDURE:  After the patient was monitored anesthetized with  general anesthesia by laryngeal mask airway technique and had routine  preparation and draping of the abdomen, I infiltrated local anesthetic  around the umbilicus. I made a 2 cm transverse infraumbilical incision and  then dissected up to the umbilical skin and separated the umbilical skin  from the subcutaneous tissues. There was no obvious hernia at first until I  dissected down to the fascia and as I separated the umbilical skin from the  area of the fascia it was obvious that there was either omentum or  preperitoneal fat which was incarcerated up in the umbilical area. I divided  that with the cautery and then dissected the subcutaneous tissues away from  the hernia defects for about 2 cm in all directions. There was a mass effect  in the umbilicus and some darkening of the umbilical skin and so I dissected  up into that and got out some brown liquid which appeared to be hemorrhagic  fluid. Because of this unusual appearance, I took a biopsy of the tissues  and sent that as a specimen even though there did not appear to be any  actual tumor present. I then closed the defect in the fascia with running 2-  0 Prolene. I cut a patch of polypropylene mesh about 3 cm around and sewed  that in  place over the repair with a running basting 2-0 Prolene suture. I  felt I had an excellent repair. I sutured the umbilical skin and the  subcutaneous tissues down to the central part of the mesh to reduce the dead  space. I closed the skin with running intracuticular 4-0 Vicryl and Steri-  Strips. The patient was stable throughout the procedure.      Lebron Conners, M.D.  Electronically Signed     WB/MEDQ  D:  02/21/2005  T:  02/21/2005  Job:  [id number]

## 2010-08-02 NOTE — Telephone Encounter (Signed)
Patient advised and she cant get off work to come today but, will try to get an appt next week

## 2010-08-02 NOTE — Telephone Encounter (Signed)
Pt is complaining of a yeast infection- has itching, burning, curdy discharge.  She is requesting that diflucan be called to [address], she says it usually takes more than one treatment for her to get over yeast infections.

## 2010-08-02 NOTE — Telephone Encounter (Signed)
Unless she has been on recent antibiotoics...she needs appt before yeast medication can be sent in. Can add her on today if she is able.

## 2010-08-03 ENCOUNTER — Ambulatory Visit (INDEPENDENT_AMBULATORY_CARE_PROVIDER_SITE_OTHER): Payer: 59 | Admitting: Family Medicine

## 2010-08-03 VITALS — BP 108/68 | HR 84 | Temp 97.9°F | Wt 142.0 lb

## 2010-08-03 DIAGNOSIS — N760 Acute vaginitis: Secondary | ICD-10-CM

## 2010-08-03 DIAGNOSIS — L293 Anogenital pruritus, unspecified: Secondary | ICD-10-CM

## 2010-08-03 MED ORDER — FLUCONAZOLE 150 MG PO TABS: ORAL_TABLET | ORAL | Status: DC

## 2010-08-03 NOTE — Progress Notes (Signed)
  Subjective:    Patient ID: Connie [name], female    DOB: Jul 24, 1960, 49 y.o.   MRN: [id number]  HPI Patient seen with some vaginal itching and mild swelling past couple of weeks. Similar symptoms with yeast vaginitis in the past. She had a hysterectomy back in February. No complications since then. Did have one yeast infection following that. No recent antibiotic use. No history of diabetes. No stool changes. No history of douching. She's had topical pain with over-the-counter antifungal such as Monistat and Gyne-Lotrimin. Has tolerated Diflucan the past.   Review of Systems  Constitutional: Negative for fever and chills.  Gastrointestinal: Negative for abdominal pain.  Genitourinary: Negative for dysuria, flank pain, vaginal bleeding, vaginal discharge and pelvic pain.       Objective:   Physical Exam  Constitutional: She appears well-developed and well-nourished. No distress.  HENT:  Mouth/Throat: Oropharynx is clear and moist.  Cardiovascular: Normal rate, regular rhythm and normal heart sounds.   Pulmonary/Chest: Effort normal and breath sounds normal. She has no wheezes. She has no rales.          Assessment & Plan:  Probable yeast vaginitis. Diflucan 150 mg and followup with primary if not resolving over the next few days

## 2010-08-03 NOTE — Patient Instructions (Signed)
Followup with primary physician if symptoms not clearing over the next week

## 2010-08-04 ENCOUNTER — Encounter: Payer: Self-pay | Admitting: Family Medicine

## 2010-11-22 ENCOUNTER — Encounter: Payer: Self-pay | Admitting: Family Medicine

## 2010-11-22 ENCOUNTER — Ambulatory Visit (INDEPENDENT_AMBULATORY_CARE_PROVIDER_SITE_OTHER): Payer: 59 | Admitting: Family Medicine

## 2010-11-22 DIAGNOSIS — H6120 Impacted cerumen, unspecified ear: Secondary | ICD-10-CM

## 2010-11-22 NOTE — Patient Instructions (Signed)
Take care and let us know if you have concerns.

## 2010-11-22 NOTE — Assessment & Plan Note (Signed)
Treated and tolerated well.  Feels better.  F/u prn.

## 2010-11-22 NOTE — Progress Notes (Signed)
Can't hear from R ear, gradual loss over last few weeks.  R ear is stopped up.  Allergies are worse recently.  L ear feels fine except for minimal stuffiness.  H/o cerumen impaction.  No FCNAV.  Nonsmoker.   Meds, vitals, and allergies reviewed.   ROS: See HPI.  Otherwise, noncontributory.  nad ncat R cerumen impaction resolved with irrigation, canal and TM wnl on recheck L TM and canal wnl Nasal and OP exam wnl Neck supple

## 2011-02-26 ENCOUNTER — Encounter: Payer: Self-pay | Admitting: Family Medicine

## 2011-02-26 ENCOUNTER — Ambulatory Visit (INDEPENDENT_AMBULATORY_CARE_PROVIDER_SITE_OTHER): Payer: 59 | Admitting: Family Medicine

## 2011-02-26 VITALS — BP 120/60 | HR 115 | Temp 98.2°F | Ht 65.0 in | Wt 148.8 lb

## 2011-02-26 DIAGNOSIS — N760 Acute vaginitis: Secondary | ICD-10-CM

## 2011-02-26 DIAGNOSIS — Z23 Encounter for immunization: Secondary | ICD-10-CM

## 2011-02-26 DIAGNOSIS — R82998 Other abnormal findings in urine: Secondary | ICD-10-CM

## 2011-02-26 DIAGNOSIS — R8290 Unspecified abnormal findings in urine: Secondary | ICD-10-CM

## 2011-02-26 LAB — POCT URINALYSIS DIPSTICK
Leukocytes, UA: NEGATIVE
Nitrite, UA: NEGATIVE
Protein, UA: NEGATIVE
pH, UA: 6

## 2011-02-26 MED ORDER — SUMATRIPTAN SUCCINATE 100 MG PO TABS: 100.0000 mg | ORAL_TABLET | ORAL | Status: DC | PRN

## 2011-02-26 MED ORDER — FLUCONAZOLE 150 MG PO TABS: ORAL_TABLET | ORAL | Status: DC

## 2011-02-26 NOTE — Progress Notes (Signed)
  Patient Name: Connie [name] Date of Birth: 06-19-1960 Age: 50 y.o. Medical Record Number: [id number] Gender: female  History of Present Illness:  Connie [name] is a 50 y.o. very pleasant female patient who presents with the following:  Pulse 80 now  Feeling like maybe a urinary tract infection, has a sweet smell. Having some itchy external discharge.   No possible STD.   Yeast infection+  Past Medical History, Surgical History, Social History, Family History, and Problem List have been reviewed in EHR and updated if relevant.  Review of Systems: ROS: GEN: Acute illness details above GI: Tolerating PO intake GU: maintaining adequate hydration and urination Pulm: No SOB Interactive and getting along well at home.  Otherwise, ROS is as per the HPI.   Physical Examination: Filed Vitals:   02/26/11 1023  BP: 120/60  Pulse: 115  Temp: 98.2 F (36.8 C)  TempSrc: Oral  Height: 5\' 5"  (1.651 m)  Weight: 148 lb 12.8 oz (67.495 kg)  SpO2: 99%    Body mass index is 24.76 kg/(m^2).   GEN: WDWN, NAD, Non-toxic, Alert & Oriented x 3 HEENT: Atraumatic, Normocephalic.  Ears and Nose: No external deformity. EXTR: No clubbing/cyanosis/edema NEURO: Normal gait.  PSYCH: Normally interactive. Conversant. Not depressed or anxious appearing.  Calm demeanor.  GU: mildly irritated externally with mildly reddish labia Abd: s, nt, nd, +bs  Assessment and Plan: Candidal vaginitis:  + scant yeast on wet prep with no trich or clue cells ua neg

## 2011-03-18 DIAGNOSIS — Z90710 Acquired absence of both cervix and uterus: Secondary | ICD-10-CM

## 2011-03-18 HISTORY — DX: Acquired absence of both cervix and uterus: Z90.710

## 2011-12-09 ENCOUNTER — Ambulatory Visit (INDEPENDENT_AMBULATORY_CARE_PROVIDER_SITE_OTHER): Payer: 59 | Admitting: Family Medicine

## 2011-12-09 ENCOUNTER — Encounter: Payer: Self-pay | Admitting: Family Medicine

## 2011-12-09 VITALS — BP 130/88 | HR 94 | Temp 98.1°F | Ht 65.5 in | Wt 150.2 lb

## 2011-12-09 DIAGNOSIS — R21 Rash and other nonspecific skin eruption: Secondary | ICD-10-CM

## 2011-12-09 MED ORDER — TRIAMCINOLONE ACETONIDE 0.5 % EX CREA: TOPICAL_CREAM | Freq: Two times a day (BID) | CUTANEOUS | Status: DC

## 2011-12-09 NOTE — Assessment & Plan Note (Signed)
Possible photosensitivity but not on any meds that would cause. No clear exposures but rash is most consistent with a contact dermatitis.  treat with topical triamcinolone cream.  Call if not improving.

## 2011-12-09 NOTE — Progress Notes (Signed)
  Subjective:    Patient ID: Connie [name], female    DOB: 1960/06/22, 51 y.o.   MRN: [id number]  Rash This is a new problem. The current episode started yesterday. The problem has been gradually worsening since onset. The affected locations include the face, neck and chest. The rash is characterized by burning, redness and itchiness. She was exposed to nothing (No new exposures, no new meds). Associated symptoms include a sore throat. Pertinent negatives include no congestion, cough, fatigue, fever, rhinorrhea or shortness of breath. Past treatments include anti-itch cream (benadryl by mouth). The treatment provided no relief. There is no history of allergies, asthma, eczema or varicella.      Review of Systems  Constitutional: Negative for fever and fatigue.  HENT: Positive for sore throat. Negative for congestion and rhinorrhea.   Respiratory: Negative for cough and shortness of breath.   Skin: Positive for rash.       Objective:   Physical Exam  Constitutional: She is oriented to person, place, and time. She appears well-developed and well-nourished.  HENT:  Head: Normocephalic.  Right Ear: External ear normal.  Left Ear: External ear normal.  Eyes: Conjunctivae normal are normal. Pupils are equal, round, and reactive to light.  Neck: Normal range of motion. Neck supple. No thyromegaly present.  Cardiovascular: Normal rate and regular rhythm.   Pulmonary/Chest: Effort normal and breath sounds normal.  Neurological: She is alert and oriented to person, place, and time.  Skin:       Erythema heat in chest and neck, some on face, blanching, no excoriations.          Assessment & Plan:

## 2011-12-09 NOTE — Patient Instructions (Addendum)
Apply steroid cream to rash. Use benadryl for itching  at night and zyrtec during the day.  Call if not improving in next 72 hours.  Go to ER if diffiuclty swallowing or breathing.

## 2013-10-24 ENCOUNTER — Encounter: Payer: Self-pay | Admitting: Family Medicine

## 2013-10-24 ENCOUNTER — Ambulatory Visit (INDEPENDENT_AMBULATORY_CARE_PROVIDER_SITE_OTHER): Payer: 59 | Admitting: Family Medicine

## 2013-10-24 VITALS — BP 92/70 | HR 70 | Temp 98.2°F | Ht 65.35 in | Wt 145.5 lb

## 2013-10-24 DIAGNOSIS — G43009 Migraine without aura, not intractable, without status migrainosus: Secondary | ICD-10-CM

## 2013-10-24 MED ORDER — SUMATRIPTAN SUCCINATE 100 MG PO TABS: ORAL_TABLET | ORAL | Status: DC

## 2013-10-24 NOTE — Patient Instructions (Signed)
Riboflavin --- can help. ? dose

## 2013-10-24 NOTE — Progress Notes (Signed)
[place] [address]                Phone: [phone]                  Fax: [phone] 10/24/2013  ID: Connie [name]   MRN: [id number]  DOB: 1960/07/25  Primary Physician:  Eliezer Lofts, MD  Chief Complaint: Migraine  Subjective:   History of Present Illness:  Connie [name] is a 53 y.o. very pleasant female patient who presents with the following:  Migraines 1-2 a week in the last month.  Before then only every few months.   Sitting in bright room now, NAD. Overall felt ok. Failure OTC meds  Past Medical History, Surgical History, Social History, Family History, Problem List, Medications, and Allergies have been reviewed and updated if relevant.  Review of Systems:  GEN: No acute illnesses, no fevers, chills. GI: No n/v/d, eating normally Pulm: No SOB Interactive and getting along well at home.  Otherwise, ROS is as per the HPI.  Objective:   Physical Examination: BP 92/70  Pulse 70  Temp(Src) 98.2 F (36.8 C) (Oral)  Ht 5' 5.35" (1.66 m)  Wt 145 lb 8 oz (65.998 kg)  BMI 23.95 kg/m2   GEN: WDWN, NAD, Non-toxic, A & O x 3 HEENT: Atraumatic, Normocephalic. Neck supple. No masses, No LAD. Ears and Nose: No external deformity. TM clear. CV: RRR, No M/G/R. No JVD. No thrill. No extra heart sounds. PULM: CTA B, no wheezes, crackles, rhonchi. No retractions. No resp. distress. No accessory muscle use. EXTR: No c/c/e NEURO Normal gait.  PSYCH: Normally interactive. Conversant. Not depressed or anxious appearing.  Calm demeanor.   Laboratory and Imaging Data:  Assessment & Plan:   COMMON MIGRAINE  Mild, refill imitrex.rest  New Prescriptions   No medications on file   Discontinued Medications   ASPIRIN-ACETAMINOPHEN-CAFFEINE (EXCEDRIN MIGRAINE) 250-250-65 MG PER TABLET    Take 1 tablet by mouth as needed.     FLUCONAZOLE (DIFLUCAN) 150 MG TABLET    Take 1 and repeat q 3 days   TRIAMCINOLONE CREAM (KENALOG) 0.5 %     Apply topically 2 (two) times daily.   ZOLPIDEM (AMBIEN) 10 MG TABLET    Take 5-10 mg by mouth at bedtime as needed.     Modified Medications   Modified Medication Previous Medication   SUMATRIPTAN (IMITREX) 100 MG TABLET SUMAtriptan (IMITREX) 100 MG tablet      MAX 200 mg in 24 hours    Take 1 tablet (100 mg total) by mouth every 2 (two) hours as needed. MAX 200 mg in 24 hours   No orders of the defined types were placed in this encounter.   Follow-up: No Follow-up on file. Unless noted above, the patient is to follow-up if symptoms worsen. Red flags were reviewed with the patient.  Signed,  Maud Deed. Lydian Chavous, MD, [facility]  Current Medications at Discharge:   Medication List       This list is accurate as of: 10/24/13 10:28 AM.  Always use your most recent med list.               diphenhydrAMINE 25 MG tablet  Commonly known as:  BENADRYL  Take 25 mg by mouth every 6 (six) hours as needed.     ibuprofen 200 MG  tablet  Commonly known as:  ADVIL,MOTRIN  Take 200 mg by mouth as needed.     SUMAtriptan 100 MG tablet  Commonly known as:  IMITREX  MAX 200 mg in 24 hours

## 2013-10-24 NOTE — Progress Notes (Signed)
Pre visit review using our clinic review tool, if applicable. No additional management support is needed unless otherwise documented below in the visit note. 

## 2013-11-08 ENCOUNTER — Other Ambulatory Visit: Payer: Self-pay | Admitting: Obstetrics and Gynecology

## 2013-11-09 LAB — CYTOLOGY - PAP

## 2014-10-09 ENCOUNTER — Telehealth: Payer: Self-pay

## 2014-10-09 NOTE — Telephone Encounter (Signed)
Called and spoke with patient, and notified them that they were due for a Mammogram. Patient states that she already had a Mammogram at [facility] this past August, and that she scheduled an appointment for August of this year since her recommendations were to come back for one once a year. Patient states that the results came back normal.

## 2014-10-13 ENCOUNTER — Encounter: Payer: Self-pay | Admitting: *Deleted

## 2014-10-13 NOTE — Progress Notes (Signed)
Pt stated had mammogram @[facility] August 2015 (telephone note 10/09/14)

## 2014-10-27 ENCOUNTER — Other Ambulatory Visit: Payer: Self-pay | Admitting: Family Medicine

## 2014-11-24 ENCOUNTER — Ambulatory Visit (INDEPENDENT_AMBULATORY_CARE_PROVIDER_SITE_OTHER): Payer: 59 | Admitting: Family Medicine

## 2014-11-24 ENCOUNTER — Encounter: Payer: Self-pay | Admitting: Family Medicine

## 2014-11-24 VITALS — BP 110/80 | HR 81 | Temp 97.8°F | Ht 65.0 in | Wt 152.2 lb

## 2014-11-24 DIAGNOSIS — G43009 Migraine without aura, not intractable, without status migrainosus: Secondary | ICD-10-CM | POA: Diagnosis not present

## 2014-11-24 MED ORDER — SUMATRIPTAN SUCCINATE 100 MG PO TABS: ORAL_TABLET | ORAL | Status: DC

## 2014-11-24 MED ORDER — TOPIRAMATE 25 MG PO CPSP: 25.0000 mg | ORAL_CAPSULE | Freq: Every day | ORAL | Status: DC

## 2014-11-24 NOTE — Patient Instructions (Signed)
Start topiramate at bedtime.  Use imitrex with ibuprofen 800 mg for severe headache.  Keep migraine diary.

## 2014-11-24 NOTE — Progress Notes (Signed)
Pre visit review using our clinic review tool, if applicable. No additional management support is needed unless otherwise documented below in the visit note. 

## 2014-11-24 NOTE — Progress Notes (Signed)
SUBJECTIVE:  Connie [name] is a 54 y.o. female who complains of migraine headache history for 18 year(s). She has a well established history of recurrent migraines.  Seeing GYN for CPX with labs.  Description of pain: squeezing pain, unilateral in the bilateral temporal area. Associated symptoms: vomiting. Imitrex work well for her in past.. More recently  Helps only temporarily.  Usually 1 per month in past, now in last 2 months has had 3-4 a month.  No clear trigger other than weather, heat, air pressure.     Current Outpatient Prescriptions  Medication Sig Dispense Refill  . ibuprofen (ADVIL,MOTRIN) 200 MG tablet Take 200 mg by mouth as needed.      . SUMAtriptan (IMITREX) 100 MG tablet MAX 2 TABLETS BY MOUTH IN 24 HOURS 10 tablet 0   No current facility-administered medications for this visit.    There are no associated abnormal neurological symptoms such as TIA's, loss of balance, loss of vision or speech, numbness or weakness on review. Past neurological history: negative for stroke, MS, epilepsy, or brain tumor.   OBJECTIVE:  Patient appears in no current pain. Her vitals are normal. Alert and oriented x 3.  Ears and throat normal. Neck fully supple without nodes. Sinuses non tender. Cranial nerves are normal.  Fundi are normal with sharp disc margins, no papilledema, hemorrhages or exudates noted. DTR's normal and symmetric. Babinski sign absent.  Mental status normal. Cerebellar function normal.   CV: RR, no murmey  Pulm: LCTAB   ASSESSMENT:

## 2014-11-24 NOTE — Progress Notes (Deleted)
   Subjective:    Patient ID: Connie [name], female    DOB: 01-Oct-1960, 54 y.o.   MRN: [id number]  HPI    Review of Systems     Objective:   Physical Exam        Assessment & Plan:

## 2014-11-24 NOTE — Assessment & Plan Note (Signed)
Start topamax for prophylaxis. Usre imitrex along with ibuprofen to treat acute headache and to try to avoid returning headache.  Headache diary, follow up in 1 month. Info on triggers provided.

## 2014-12-15 ENCOUNTER — Telehealth: Payer: Self-pay

## 2014-12-15 NOTE — Telephone Encounter (Signed)
Great. No appt needed.

## 2014-12-15 NOTE — Telephone Encounter (Signed)
Pt left v/m; pt seen 11/24/14, pt thinks she can cb with update about migraines rather than scheduling appt in one month from 11/24/14 visit; topiramate is helping migraines; taking topiramate daily was too strong and pt is taking topiramate every other day and pt is doing well; pt has had 2 migraines since 11/24/14. The imitrex followed by ibuprofen 800 mg as instructed by Dr Diona Browner was successful in getting rid of migraine. Call pt if needed. Also as FYI due to ins coverage pt is changing to mail order pharmacy and [facility] will request refills when needed.

## 2014-12-18 ENCOUNTER — Other Ambulatory Visit: Payer: Self-pay | Admitting: *Deleted

## 2014-12-18 MED ORDER — TOPIRAMATE 25 MG PO CPSP: 25.0000 mg | ORAL_CAPSULE | Freq: Every day | ORAL | Status: DC

## 2014-12-18 MED ORDER — SUMATRIPTAN SUCCINATE 100 MG PO TABS: ORAL_TABLET | ORAL | Status: DC

## 2014-12-18 NOTE — Addendum Note (Signed)
Addended by: Carter Kitten on: 12/18/2014 03:20 PM   Modules accepted: Orders

## 2014-12-19 NOTE — Addendum Note (Signed)
Addended by: Carter Kitten on: 12/19/2014 02:50 PM   Modules accepted: Orders, Medications

## 2015-02-20 ENCOUNTER — Other Ambulatory Visit: Payer: Self-pay | Admitting: Family Medicine

## 2015-03-13 ENCOUNTER — Other Ambulatory Visit: Payer: Self-pay | Admitting: Family Medicine

## 2015-03-30 ENCOUNTER — Encounter: Payer: Self-pay | Admitting: Family Medicine

## 2015-03-30 ENCOUNTER — Ambulatory Visit (INDEPENDENT_AMBULATORY_CARE_PROVIDER_SITE_OTHER): Payer: 59 | Admitting: Family Medicine

## 2015-03-30 VITALS — BP 112/78 | HR 84 | Temp 98.5°F | Ht 65.0 in | Wt 151.5 lb

## 2015-03-30 DIAGNOSIS — B09 Unspecified viral infection characterized by skin and mucous membrane lesions: Secondary | ICD-10-CM | POA: Insufficient documentation

## 2015-03-30 DIAGNOSIS — J069 Acute upper respiratory infection, unspecified: Secondary | ICD-10-CM | POA: Diagnosis not present

## 2015-03-30 DIAGNOSIS — J029 Acute pharyngitis, unspecified: Secondary | ICD-10-CM | POA: Diagnosis not present

## 2015-03-30 DIAGNOSIS — B9789 Other viral agents as the cause of diseases classified elsewhere: Secondary | ICD-10-CM

## 2015-03-30 LAB — POCT RAPID STREP A (OFFICE): Rapid Strep A Screen: NEGATIVE

## 2015-03-30 NOTE — Patient Instructions (Signed)
Mucinex DM twice daily for cough and congestion.  Ibuprofen 800 mg every 8 hours for sore throat.  Rest, fluids. Call if not improving in 7-10 days.

## 2015-03-30 NOTE — Progress Notes (Signed)
Pre visit review using our clinic review tool, if applicable. No additional management support is needed unless otherwise documented below in the visit note. 

## 2015-03-30 NOTE — Assessment & Plan Note (Signed)
Appears like viral rash instead of strep rash.

## 2015-03-30 NOTE — Assessment & Plan Note (Signed)
Neg strep.  Symtpomatic care.

## 2015-03-30 NOTE — Progress Notes (Signed)
   Subjective:    Patient ID: Connie [name], female    DOB: 1960-05-17, 55 y.o.   MRN: [id number]  Sore Throat  This is a new problem. The current episode started in the past 7 days (2 days). Neither side of throat is experiencing more pain than the other. There has been no fever. Associated symptoms include congestion, coughing, headaches, swollen glands and trouble swallowing. Pertinent negatives include no ear discharge, ear pain, hoarse voice, plugged ear sensation or shortness of breath. Associated symptoms comments: white pockets in throat , rash on chest.. She has had no exposure to strep or mono. She has tried acetaminophen for the symptoms. The treatment provided moderate relief.  Rash This is a new problem. The current episode started yesterday. Rash characteristics: not itchy. She was exposed to nothing. Associated symptoms include congestion and coughing. Pertinent negatives include no shortness of breath. Past treatments include nothing. The treatment provided no relief. There is no history of allergies, asthma, eczema or varicella.    Social History /Family History/Past Medical History reviewed and updated if needed.   Review of Systems  HENT: Positive for congestion and trouble swallowing. Negative for ear discharge, ear pain and hoarse voice.   Respiratory: Positive for cough. Negative for shortness of breath.   Skin: Positive for rash.  Neurological: Positive for headaches.       Objective:   Physical Exam  Constitutional: Vital signs are normal. She appears well-developed and well-nourished. She is cooperative.  Non-toxic appearance. She does not appear ill. No distress.  HENT:  Head: Normocephalic.  Right Ear: Hearing, tympanic membrane, external ear and ear canal normal. Tympanic membrane is not erythematous, not retracted and not bulging. No middle ear effusion.  Left Ear: Hearing, tympanic membrane, external ear and ear canal normal. Tympanic membrane is not  erythematous, not retracted and not bulging.  No middle ear effusion.  Nose: Mucosal edema and rhinorrhea present. Right sinus exhibits no maxillary sinus tenderness and no frontal sinus tenderness. Left sinus exhibits no maxillary sinus tenderness and no frontal sinus tenderness.  Mouth/Throat: Uvula is midline and mucous membranes are normal. Posterior oropharyngeal edema and posterior oropharyngeal erythema present. No oropharyngeal exudate.  Eyes: Conjunctivae, EOM and lids are normal. Pupils are equal, round, and reactive to light. Lids are everted and swept, no foreign bodies found.  Neck: Trachea normal and normal range of motion. Neck supple. Carotid bruit is not present. No thyroid mass and no thyromegaly present.  Cardiovascular: Normal rate, regular rhythm, S1 normal, S2 normal, normal heart sounds, intact distal pulses and normal pulses.  Exam reveals no gallop and no friction rub.   No murmur heard. Pulmonary/Chest: Effort normal and breath sounds normal. No tachypnea. No respiratory distress. She has no decreased breath sounds. She has no wheezes. She has no rhonchi. She has no rales.  Abdominal: Soft. Normal appearance and bowel sounds are normal. There is no tenderness.  Neurological: She is alert.  Skin: Skin is warm, dry and intact. Rash noted.     Patchy red macules on chest.. Not typical of strep rash.   Psychiatric: Her speech is normal and behavior is normal. Judgment and thought content normal. Her mood appears not anxious. Cognition and memory are normal. She does not exhibit a depressed mood.          Assessment & Plan:

## 2015-04-20 ENCOUNTER — Emergency Department
Admission: EM | Admit: 2015-04-20 | Discharge: 2015-04-20 | Disposition: A | Discharge status: Home / Self Care | Payer: 59 | Attending: Emergency Medicine | Admitting: Emergency Medicine

## 2015-04-20 ENCOUNTER — Telehealth: Payer: Self-pay | Admitting: Family Medicine

## 2015-04-20 ENCOUNTER — Encounter: Payer: Self-pay | Admitting: *Deleted

## 2015-04-20 DIAGNOSIS — Z79899 Other long term (current) drug therapy: Secondary | ICD-10-CM | POA: Insufficient documentation

## 2015-04-20 DIAGNOSIS — Z880 Allergy status to penicillin: Secondary | ICD-10-CM | POA: Diagnosis not present

## 2015-04-20 DIAGNOSIS — R51 Headache: Secondary | ICD-10-CM | POA: Diagnosis present

## 2015-04-20 DIAGNOSIS — G43709 Chronic migraine without aura, not intractable, without status migrainosus: Secondary | ICD-10-CM | POA: Insufficient documentation

## 2015-04-20 DIAGNOSIS — G43909 Migraine, unspecified, not intractable, without status migrainosus: Secondary | ICD-10-CM

## 2015-04-20 LAB — CBC
HEMATOCRIT: 45.1 % (ref 35.0–47.0)
HEMOGLOBIN: 15.2 g/dL (ref 12.0–16.0)
MCH: 30.2 pg (ref 26.0–34.0)
MCHC: 33.6 g/dL (ref 32.0–36.0)
MCV: 90 fL (ref 80.0–100.0)
Platelets: 283 10*3/uL (ref 150–440)
RBC: 5.01 MIL/uL (ref 3.80–5.20)
RDW: 12.8 % (ref 11.5–14.5)
WBC: 7.6 10*3/uL (ref 3.6–11.0)

## 2015-04-20 LAB — BASIC METABOLIC PANEL
ANION GAP: 9 (ref 5–15)
BUN: 12 mg/dL (ref 6–20)
CALCIUM: 9.2 mg/dL (ref 8.9–10.3)
CHLORIDE: 106 mmol/L (ref 101–111)
CO2: 25 mmol/L (ref 22–32)
Creatinine, Ser: 0.62 mg/dL (ref 0.44–1.00)
GFR calc non Af Amer: >60 mL/min (ref >60)
Glucose, Bld: 105 mg/dL — ABNORMAL HIGH (ref 65–99)
POTASSIUM: 3.7 mmol/L (ref 3.5–5.1)
Sodium: 140 mmol/L (ref 135–145)

## 2015-04-20 MED ORDER — SODIUM CHLORIDE 0.9 % IV BOLUS (SEPSIS)
1000.0000 mL | Freq: Once | INTRAVENOUS | Status: AC
  Administered 2015-04-20: 1000 mL via INTRAVENOUS

## 2015-04-20 MED ORDER — TRAMADOL HCL 50 MG PO TABS: 50.0000 mg | ORAL_TABLET | Freq: Four times a day (QID) | ORAL | Status: DC | PRN

## 2015-04-20 MED ORDER — DIPHENHYDRAMINE HCL 50 MG/ML IJ SOLN
25.0000 mg | Freq: Once | INTRAMUSCULAR | Status: AC
  Administered 2015-04-20: 25 mg via INTRAVENOUS
  Filled 2015-04-20: qty 1

## 2015-04-20 MED ORDER — METOCLOPRAMIDE HCL 10 MG PO TABS: 10.0000 mg | ORAL_TABLET | Freq: Three times a day (TID) | ORAL | Status: DC | PRN

## 2015-04-20 MED ORDER — METOCLOPRAMIDE HCL 5 MG/ML IJ SOLN
10.0000 mg | Freq: Once | INTRAMUSCULAR | Status: AC
  Administered 2015-04-20: 10 mg via INTRAVENOUS
  Filled 2015-04-20: qty 2

## 2015-04-20 MED ORDER — KETOROLAC TROMETHAMINE 30 MG/ML IJ SOLN
15.0000 mg | Freq: Once | INTRAMUSCULAR | Status: AC
  Administered 2015-04-20: 15 mg via INTRAVENOUS
  Filled 2015-04-20: qty 1

## 2015-04-20 NOTE — Telephone Encounter (Signed)
Patient Name: Connie [name] DOB: 1960-04-25 Initial Comment Caller states wife has a history of migraines- was on medication but had a bad rash from [facility] to know what to do Nurse Assessment Nurse: Vallery Sa, RN, [facility] [place] Date/Time (Eastern Time): 04/20/2015 9:30:42 AM Confirm and document reason for call. If symptomatic, describe symptoms. You must click the next button to save text entered. ---Caller states that Marche developed another Migraine yesterday. No fever. No injury in the past 3 days. Alert and responsive. Has the patient traveled out of the country within the last 30 days? ---No Does the patient have any new or worsening symptoms? ---Yes Will a triage be completed? ---Yes Related visit to physician within the last 2 weeks? ---No Does the PT have any chronic conditions? (i.e. diabetes, asthma, etc.) ---Yes List chronic conditions. ---Migraines (Rash reaction to previous Migraine medications) Did the patient indicate they were pregnant? ---No Is this a behavioral health or substance abuse call? ---No Guidelines Guideline Title Affirmed Question Affirmed Notes Headache Severe pain in one eye Final Disposition User Go to ED Now Vallery Sa, RN, [facility] - ED Disagree/Comply: Comply

## 2015-04-20 NOTE — ED Provider Notes (Signed)
Time Seen: Approximately 12:30  I have reviewed the triage notes  Chief Complaint: Migraine   History of Present Illness: Connie [name] is a 55 y.o. female who presents with a long history of migraine headaches. Patient normally takes Imitrex but her most recent dosage over the weekend she states she felt like she had an adverse effect with some nausea and numbness in her throat, shortness of breath, etc. The patient states the headache has now moved from the right to the left and this particular headache started yesterday afternoon. She states as far as the characteristics of her headache and seems to be consistent with previous migraines in the past. Patient denies any fever or trauma or neck discomfort. She states that she's had similar headaches before this may be one of the more intense migraines and she didn't want to take her Imitrex again. She describes some blurred vision which is again consistent with her previous migraines with some generalized weakness. Denies any focal weakness.  Past Medical History  Diagnosis Date  . Reflux   . Post partum depression 1986, 1998    W/both children - Tx with antidepressants for second child  . Migraines     Patient Active Problem List   Diagnosis Date Noted  . Viral URI with cough 03/30/2015  . Viral rash 03/30/2015  . FIBROIDS, UTERUS 01/25/2010  . ANEMIA, IRON DEFICIENCY 11/23/2009  . MENORRHAGIA 11/23/2009  . LIBIDO, DECREASED 11/23/2009  . LEG CRAMPS, NOCTURNAL 07/19/2009  . INSOMNIA 07/19/2009  . DEPRESSION 05/28/2007  . Migraine without aura 05/28/2007  . GERD 05/28/2007    Past Surgical History  Procedure Laterality Date  . Umbilical hernia repair  123456    No complications  . Vaginal births  1986, 1998    Past Surgical History  Procedure Laterality Date  . Umbilical hernia repair  123456    No complications  . Vaginal births  1986, 1998    Current Outpatient Rx  Name  Route  Sig  Dispense  Refill  . ibuprofen  (ADVIL,MOTRIN) 200 MG tablet   Oral   Take 200 mg by mouth as needed.           . SUMAtriptan (IMITREX) 100 MG tablet      Take as directed - may  repeat in 2 hours if  headache persists or recurs   10 tablet   2   . zolpidem (AMBIEN) 10 MG tablet   Oral   Take 10 mg by mouth at bedtime.      5     Allergies:  Ciprofloxacin; Estrogens; Nitrofurantoin; Penicillins; Phenazopyridine hcl; and Sulfamethoxazole-trimethoprim  Family History: Family History  Problem Relation Age of Onset  . Hyperlipidemia Mother   . Fibromyalgia Mother   . Osteoporosis Mother   . Hypertension Mother   . Hyperlipidemia Father   . Heart disease Father 28    MI, Tipple bypass surgery  . Asthma Son   . Allergies Son     Social History: Social History  Substance Use Topics  . Smoking status: Never Smoker   . Smokeless tobacco: Never Used  . Alcohol Use: No     Review of Systems:   10 point review of systems was performed and was otherwise negative:  Constitutional: No fever Eyes: No visual disturbances ENT: No sore throat, ear pain Cardiac: No chest pain Respiratory: No shortness of breath, wheezing, or stridor Abdomen: No abdominal pain, no vomiting, No diarrhea Endocrine: No weight loss, No night sweats  Extremities: No peripheral edema, cyanosis Skin: No rashes, easy bruising Neurologic: No focal weakness, trouble with speech or swollowing Urologic: No dysuria, Hematuria, or urinary frequency   Physical Exam:  ED Triage Vitals  Enc Vitals Group     BP 04/20/15 1046 135/96 mmHg     Pulse Rate 04/20/15 1046 78     Resp 04/20/15 1046 18     Temp 04/20/15 1046 98.3 F (36.8 C)     Temp Source 04/20/15 1046 Oral     SpO2 04/20/15 1046 99 %     Weight 04/20/15 1046 150 lb (68.04 kg)     Height 04/20/15 1046 5\' 5"  (1.651 m)     Head Cir --      Peak Flow --      Pain Score 04/20/15 1047 10     Pain Loc --      Pain Edu? --      Excl. in [place]? --     General: Awake ,  Alert , and Oriented times 3; GCS 15 Head: Normal cephalic , atraumatic Eyes: Pupils equal , round, reactive to light patient has bilateral photophobia Nose/Throat: No nasal drainage, patent upper airway without erythema or exudate.  Neck: Supple, Full range of motion, No anterior adenopathy or palpable thyroid masses Lungs: Clear to ascultation without wheezes , rhonchi, or rales Heart: Regular rate, regular rhythm without murmurs , gallops , or rubs Abdomen: Soft, non tender without rebound, guarding , or rigidity; bowel sounds positive and symmetric in all 4 quadrants. No organomegaly .        Extremities: 2 plus symmetric pulses. No edema, clubbing or cyanosis Neurologic: normal ambulation, Motor symmetric without deficits, sensory intact Skin: warm, dry, no rashes   Labs:   All laboratory work was reviewed including any pertinent negatives or positives listed below:  Labs Reviewed  CBC  BASIC METABOLIC PANEL        ED Course:  Differential diagnosis includes but is not exclusive to subarachnoid hemorrhage, meningitis, encephalitis, previous head trauma, cavernous venous thrombosis, muscle tension headache, temporal arteritis, migraine or migraine equivalent, etc.   Patient's stay here was uneventful and she was given an IV fluid bolus along with Reglan 10 mg IV, Benadryl 25 mg IV, and 15 mg of Toradol with symptomatic relief. States her headache essentially resolved and I felt that this was unlikely to be a life-threatening cause for headache such as a subarachnoid hemorrhage, etc. I felt imaging was not necessary at this time though advised her that if her headache persists that she may need further radiologic evaluation. She was referred back to her primary physician.  Assessment: Acute exacerbation of chronic migraine cephalgia      Plan:  Outpatient management Patient was advised to return immediately if condition worsens. Patient was advised to follow up with their  primary care physician or other specialized physicians involved in their outpatient care            Daymon Larsen, MD 04/20/15 [phone]

## 2015-04-20 NOTE — ED Notes (Signed)
AAOX3.  SKIN WARM AND DRY.  AMBULATES WITH EASY AND STEADY GAIT.  MOVING ALL EXTREMITIES EQUALLY AND STRONG.

## 2015-04-20 NOTE — ED Notes (Signed)
States migrane that began yesterday, states hx of migranes, says she has not taken anything for the migrane because she had a bad reaction to her imitrex recently, pt states nausea and photosensitivity with blurry vision

## 2015-04-20 NOTE — Discharge Instructions (Signed)
Migraine Headache °A migraine headache is an intense, throbbing pain on one or both sides of your head. A migraine can last for 30 minutes to several hours. °CAUSES  °The exact cause of a migraine headache is not always known. However, a migraine may be caused when nerves in the brain become irritated and release chemicals that cause inflammation. This causes pain. °Certain things may also trigger migraines, such as: °· Alcohol. °· Smoking. °· Stress. °· Menstruation. °· Aged cheeses. °· Foods or drinks that contain nitrates, glutamate, aspartame, or tyramine. °· Lack of sleep. °· Chocolate. °· Caffeine. °· Hunger. °· Physical exertion. °· Fatigue. °· Medicines used to treat chest pain (nitroglycerine), birth control pills, estrogen, and some blood pressure medicines. °SIGNS AND SYMPTOMS °· Pain on one or both sides of your head. °· Pulsating or throbbing pain. °· Severe pain that prevents daily activities. °· Pain that is aggravated by any physical activity. °· Nausea, vomiting, or both. °· Dizziness. °· Pain with exposure to bright lights, loud noises, or activity. °· General sensitivity to bright lights, loud noises, or smells. °Before you get a migraine, you may get warning signs that a migraine is coming (aura). An aura may include: °· Seeing flashing lights. °· Seeing bright spots, halos, or zigzag lines. °· Having tunnel vision or blurred vision. °· Having feelings of numbness or tingling. °· Having trouble talking. °· Having muscle weakness. °DIAGNOSIS  °A migraine headache is often diagnosed based on: °· Symptoms. °· Physical exam. °· A CT scan or MRI of your head. These imaging tests cannot diagnose migraines, but they can help rule out other causes of headaches. °TREATMENT °Medicines may be given for pain and nausea. Medicines can also be given to help prevent recurrent migraines.  °HOME CARE INSTRUCTIONS °· Only take over-the-counter or prescription medicines for pain or discomfort as directed by your  health care provider. The use of long-term narcotics is not recommended. °· Lie down in a dark, quiet room when you have a migraine. °· Keep a journal to find out what may trigger your migraine headaches. For example, write down: °¨ What you eat and drink. °¨ How much sleep you get. °¨ Any change to your diet or medicines. °· Limit alcohol consumption. °· Quit smoking if you smoke. °· Get 7-9 hours of sleep, or as recommended by your health care provider. °· Limit stress. °· Keep lights dim if bright lights bother you and make your migraines worse. °SEEK IMMEDIATE MEDICAL CARE IF:  °· Your migraine becomes severe. °· You have a fever. °· You have a stiff neck. °· You have vision loss. °· You have muscular weakness or loss of muscle control. °· You start losing your balance or have trouble walking. °· You feel faint or pass out. °· You have severe symptoms that are different from your first symptoms. °MAKE SURE YOU:  °· Understand these instructions. °· Will watch your condition. °· Will get help right away if you are not doing well or get worse. °  °This information is not intended to replace advice given to you by your health care provider. Make sure you discuss any questions you have with your health care provider. °  °Document Released: 03/03/2005 Document Revised: 03/24/2014 Document Reviewed: 11/08/2012 °Elsevier Interactive Patient Education ©2016 [employer] ° °Please return immediately if condition worsens. Please contact her primary physician or the physician you were given for referral. If you have any specialist physicians involved in her treatment and plan please also contact   them. Thank you for using [facility]. You can also take over-the-counter ibuprofen along with Benadryl.

## 2015-04-20 NOTE — ED Notes (Signed)
Pt given ice pack for head

## 2016-12-18 ENCOUNTER — Other Ambulatory Visit: Payer: Self-pay | Admitting: Obstetrics and Gynecology

## 2016-12-18 DIAGNOSIS — R928 Other abnormal and inconclusive findings on diagnostic imaging of breast: Secondary | ICD-10-CM

## 2016-12-18 LAB — HM PAP SMEAR: HM Pap smear: NEGATIVE

## 2016-12-24 ENCOUNTER — Ambulatory Visit
Admission: RE | Admit: 2016-12-24 | Discharge: 2016-12-24 | Disposition: A | Discharge status: Home / Self Care | Payer: 59 | Source: Ambulatory Visit | Attending: Obstetrics and Gynecology | Admitting: Obstetrics and Gynecology

## 2016-12-24 DIAGNOSIS — R928 Other abnormal and inconclusive findings on diagnostic imaging of breast: Secondary | ICD-10-CM

## 2017-01-15 ENCOUNTER — Ambulatory Visit (INDEPENDENT_AMBULATORY_CARE_PROVIDER_SITE_OTHER): Payer: Self-pay | Admitting: Orthopaedic Surgery

## 2017-11-25 ENCOUNTER — Encounter: Payer: Self-pay | Admitting: Family Medicine

## 2017-11-25 ENCOUNTER — Ambulatory Visit (INDEPENDENT_AMBULATORY_CARE_PROVIDER_SITE_OTHER): Payer: 59 | Admitting: Family Medicine

## 2017-11-25 VITALS — BP 120/78 | HR 96 | Temp 97.8°F | Ht 65.0 in | Wt 177.0 lb

## 2017-11-25 DIAGNOSIS — N390 Urinary tract infection, site not specified: Secondary | ICD-10-CM | POA: Diagnosis not present

## 2017-11-25 DIAGNOSIS — R300 Dysuria: Secondary | ICD-10-CM | POA: Diagnosis not present

## 2017-11-25 LAB — POC URINALSYSI DIPSTICK (AUTOMATED)
BILIRUBIN UA: NEGATIVE
Glucose, UA: NEGATIVE
Ketones, UA: NEGATIVE
NITRITE UA: NEGATIVE
PH UA: 7 (ref 5.0–8.0)
PROTEIN UA: NEGATIVE
Spec Grav, UA: 1.01 (ref 1.010–1.025)
UROBILINOGEN UA: NEGATIVE U/dL — AB

## 2017-11-25 MED ORDER — CEFTRIAXONE SODIUM 1 G IJ SOLR
1.0000 g | Freq: Once | INTRAMUSCULAR | Status: AC
  Administered 2017-11-25: 1 g via INTRAMUSCULAR

## 2017-11-25 MED ORDER — CEPHALEXIN 500 MG PO CAPS: 500.0000 mg | ORAL_CAPSULE | Freq: Two times a day (BID) | ORAL | 0 refills | Status: DC

## 2017-11-25 NOTE — Patient Instructions (Addendum)
You may have some source of infection causing recurrent UTIs- treat with rocephin shot today then take another course of keflex sent to pharmacy.  I have requested records of recent urine cultures from [facility] See our referral coordinators for urology appointment.

## 2017-11-25 NOTE — Progress Notes (Addendum)
BP 120/78 (BP Location: Left Arm, Patient Position: Sitting, Cuff Size: Normal)   Pulse 96   Temp 97.8 F (36.6 C) (Oral)   Ht 5\' 5"  (1.651 m)   Wt 177 lb (80.3 kg)   SpO2 100%   BMI 29.45 kg/m    CC: recurrent UTI Subjective:    Patient ID: Connie [name], female    DOB: [birth year], 57 y.o.   MRN: [id number]  HPI: Connie [name] is a 57 y.o. female presenting on 11/25/2017 for Dysuria (C/o burning with urination, lower back pain and lower abd pain. Seen at UC 2x (10/31/17, 11/09/17), dx UTI, tx abx both times. Sxs went away for a few days and returned. )   Seen twice at [facility] last month ([facility] in [place]) with UTI dx, both times treated with abx without full resolution. Ongoing symptoms. Treated with 1 wk keflex then 1 wk of doxycycline. UCx checked both times, thought treated appropriately. Yesterday symptoms started recurring - lower back pain, dysuria. Has increased water intake. Mild nausea.  Denies fevers/chills, urgency, frequency, hematuria. No vomiting.   Ongoing persistent headache, neck discomfort throughout illness.  Last UTI prior to this was several years ago.  No recent catheter use. No prior abx use.  She had kidney stones 36 yrs ago while pregnant.   Azo allergy (itching/swelling of mouth). Cipro (rash), PCN (as an infant ?rash), nitrofurantoin (severe - chest pain and body aches "felt like the flu"), and sulfa allergies (diffuse itching).   ADDENDUM ==> [facility] records reviewed - no cultures sent. Initial UA + LE and blood, second UA normal.   Relevant past medical, surgical, family and social history reviewed and updated as indicated. Interim medical history since our last visit reviewed. Allergies and medications reviewed and updated. Outpatient Medications Prior to Visit  Medication Sig Dispense Refill  . ibuprofen (ADVIL,MOTRIN) 200 MG tablet Take 200 mg by mouth as needed.      . metoCLOPramide (REGLAN) 10 MG tablet Take 1 tablet (10 mg total) by mouth every 8  (eight) hours as needed for nausea. 24 tablet 0  . SUMAtriptan (IMITREX) 100 MG tablet Take as directed - may  repeat in 2 hours if  headache persists or recurs 10 tablet 2  . traMADol (ULTRAM) 50 MG tablet Take 1 tablet (50 mg total) by mouth every 6 (six) hours as needed. 20 tablet 0  . zolpidem (AMBIEN) 10 MG tablet Take 10 mg by mouth at bedtime.  5   No facility-administered medications prior to visit.      Per HPI unless specifically indicated in ROS section below Review of Systems     Objective:    BP 120/78 (BP Location: Left Arm, Patient Position: Sitting, Cuff Size: Normal)   Pulse 96   Temp 97.8 F (36.6 C) (Oral)   Ht 5\' 5"  (1.651 m)   Wt 177 lb (80.3 kg)   SpO2 100%   BMI 29.45 kg/m   Wt Readings from Last 3 Encounters:  11/25/17 177 lb (80.3 kg)  04/20/15 150 lb (68 kg)  03/30/15 151 lb 8 oz (68.7 kg)    Physical Exam  Constitutional: She appears well-developed and well-nourished. No distress.  Abdominal: Soft. Bowel sounds are normal. She exhibits no distension and no mass. There is no tenderness. There is CVA tenderness (right sided). There is no rebound and no guarding. No hernia.  Musculoskeletal: She exhibits no edema.  Skin: No rash noted.  Psychiatric: She has a normal mood and  affect.  Nursing note and vitals reviewed.  Results for orders placed or performed in visit on 11/25/17  POCT Urinalysis Dipstick (Automated)  Result Value Ref Range   Color, UA straw    Clarity, UA cloudy    Glucose, UA Negative Negative   Bilirubin, UA neg    Ketones, UA neg    Spec Grav, UA 1.010 1.010 - 1.025   Blood, UA tr    pH, UA 7.0 5.0 - 8.0   Protein, UA Negative Negative   Urobilinogen, UA negative (A) 0.2 or 1.0 E.U./dL   Nitrite, UA neg    Leukocytes, UA Small (1+) (A) Negative      Assessment & Plan:   Problem List Items Addressed This Visit    Recurrent UTI - Primary    Concern for recurrent UTI given symptoms and history. UA with 1+ LE but micro  without significant signs of infection. UCx sent.  Given CVA tenderness and limited abx options, will give rocephin 1gm IM today and send keflex 500mg  bid 1wk course while we await urine culture. Will also refer to urology to r/o source of ongoing infection. Discussed avoiding bladder irritants, starting cranberry tablets. [facility] records (UCx x2) requested today      Relevant Medications   cephALEXin (KEFLEX) 500 MG capsule   cefTRIAXone (ROCEPHIN) injection 1 g (Completed)   Other Relevant Orders   Ambulatory referral to Urology    Other Visit Diagnoses    Dysuria       Relevant Orders   POCT Urinalysis Dipstick (Automated) (Completed)   Urine Culture       Meds ordered this encounter  Medications  . cephALEXin (KEFLEX) 500 MG capsule    Sig: Take 1 capsule (500 mg total) by mouth 2 (two) times daily.    Dispense:  14 capsule    Refill:  0  . cefTRIAXone (ROCEPHIN) injection 1 g   Orders Placed This Encounter  Procedures  . Urine Culture  . Ambulatory referral to Urology    Referral Priority:   Routine    Referral Type:   Consultation    Referral Reason:   Specialty Services Required    Requested Specialty:   Urology    Number of Visits Requested:   1  . POCT Urinalysis Dipstick (Automated)    Follow up plan: Return if symptoms worsen or fail to improve.  Ria Bush, MD

## 2017-11-25 NOTE — Assessment & Plan Note (Addendum)
Concern for recurrent UTI given symptoms and history. UA with 1+ LE but micro without significant signs of infection. UCx sent.  Given CVA tenderness and limited abx options, will give rocephin 1gm IM today and send keflex 500mg  bid 1wk course while we await urine culture. Will also refer to urology to r/o source of ongoing infection. Discussed avoiding bladder irritants, starting cranberry tablets. [facility] records (UCx x2) requested today

## 2017-11-27 LAB — URINE CULTURE
MICRO NUMBER:: 91088175
SPECIMEN QUALITY:: ADEQUATE

## 2017-12-02 ENCOUNTER — Encounter: Payer: Self-pay | Admitting: Urology

## 2017-12-02 ENCOUNTER — Ambulatory Visit
Admission: RE | Admit: 2017-12-02 | Discharge: 2017-12-02 | Disposition: A | Discharge status: Home / Self Care | Payer: 59 | Source: Ambulatory Visit | Attending: Urology | Admitting: Urology

## 2017-12-02 ENCOUNTER — Telehealth: Payer: Self-pay | Admitting: Urology

## 2017-12-02 ENCOUNTER — Ambulatory Visit (INDEPENDENT_AMBULATORY_CARE_PROVIDER_SITE_OTHER): Payer: 59 | Admitting: Urology

## 2017-12-02 VITALS — BP 129/84 | HR 85 | Ht 65.0 in | Wt 174.8 lb

## 2017-12-02 DIAGNOSIS — N390 Urinary tract infection, site not specified: Secondary | ICD-10-CM | POA: Diagnosis not present

## 2017-12-02 DIAGNOSIS — N3946 Mixed incontinence: Secondary | ICD-10-CM

## 2017-12-02 DIAGNOSIS — R109 Unspecified abdominal pain: Secondary | ICD-10-CM | POA: Diagnosis not present

## 2017-12-02 DIAGNOSIS — R300 Dysuria: Secondary | ICD-10-CM

## 2017-12-02 LAB — URINALYSIS, COMPLETE
Bilirubin, UA: NEGATIVE
GLUCOSE, UA: NEGATIVE
KETONES UA: NEGATIVE
Leukocytes, UA: NEGATIVE
NITRITE UA: NEGATIVE
PROTEIN UA: NEGATIVE
SPEC GRAV UA: 1.01 (ref 1.005–1.030)
Urobilinogen, Ur: 0.2 mg/dL (ref 0.2–1.0)
pH, UA: 7 (ref 5.0–7.5)

## 2017-12-02 LAB — MICROSCOPIC EXAMINATION
BACTERIA UA: NONE SEEN
WBC UA: NONE SEEN /HPF (ref 0–5)

## 2017-12-02 LAB — BLADDER SCAN AMB NON-IMAGING: Scan Result: 70

## 2017-12-02 NOTE — Telephone Encounter (Signed)
Please let Analysia know that the final report on her x-ray concurs with my findings of no stones, so I have gone ahead and ordered a renal ultrasound.  She will need an appointment to discuss the results.

## 2017-12-02 NOTE — Telephone Encounter (Signed)
Please let Mrs. Connie [name] know that I do not see any stones on her x-ray, but I would like to wait until I get the official report from radiology before ordering the kidney ultrasound.  I should have that reading by tomorrow.

## 2017-12-02 NOTE — Telephone Encounter (Signed)
Patient notified

## 2017-12-02 NOTE — Progress Notes (Signed)
12/02/2017 5:00 PM   Connie [name] April 20, 1960 [id number]  Referring provider: Jinny Sanders, MD [address] [place], [place] [address]  Chief Complaint  Patient presents with  . Establish Care    HPI: Patient is a 57 year old Caucasian female who is referred to [place] by Dr. Ria Bush for recurrent urinary tract infections.  She states that in August she started with LBP associated with 24 hours of diarrhea.  A week later, she experienced dysuria, suprapubic pain along with the LBP.    She then went to [facility], she was placed on Keflex.  She states her symptoms went down but did not go away.  She returned to [place] and placed on doxycycline.  The symptoms abated with the doxycycline, but they returned with a vengeance three days later with increased urgency and nocturia.  She then sought treatment with her PCP and was given a shot of Rocephin and  Keflex.  The urine culture was positive for proteus.   She is having right flank pain at this time.  She does have a history of stone, thirty years ago, which she passed spontaneously.  She also feels that her urinary tract infection symptoms are returning.  Her UA today was negative.  Her PVR is 70 mL.    Reviewing her records, she has had one documented positive urine culture. + proteus mirabilis resistant to nitrofurantoin on 11/25/2017  She denies/endorses dysuria, gross hematuria, suprapubic pain, back pain, abdominal pain or flank pain associated with UTI's.    She has not had any recent fevers, chills, nausea or vomiting associated with UTI's.   She does not have a history of GU surgery or GU trauma.   She is peri menopausal.  She admits to constipation.  She has mild stress and urge incontinence for which she wears 1 thin incontinence pads daily.   She is has to make herself drink water.  She drinks mostly coffee, 5 cups.  She also drinks 12 to 24 ounces regular Cokes daily.  She also loves iced sweet tea.  She  does not drink juice.  She does not drink alcohol.      PMH: Past Medical History:  Diagnosis Date  . Acid reflux 5 years  . H/O umbilical hernia repair 6213  . Hx of hysterectomy 2013  . Migraines   . Post partum depression 1986, 1998   W/both children - Tx with antidepressants for second child  . Reflux     Surgical History: Past Surgical History:  Procedure Laterality Date  . UMBILICAL HERNIA REPAIR  0865   No complications  . Vaginal births  1986, 1998    Home Medications:  Allergies as of 12/02/2017      Reactions   Estrogens Itching   Nitrofurantoin    REACTION: severe reaction   Penicillins    REACTION: as child   Phenazopyridine Hcl Itching, Swelling   REACTION: unspecified   Sulfamethoxazole-trimethoprim Itching   REACTION: unspecified   Ciprofloxacin Rash   REACTION: unspecified      Medication List        Accurate as of 12/02/17  5:00 PM. Always use your most recent med list.          cephALEXin 500 MG capsule Commonly known as:  KEFLEX Take 1 capsule (500 mg total) by mouth 2 (two) times daily.   ibuprofen 200 MG tablet Commonly known as:  ADVIL,MOTRIN Take 200 mg by mouth as needed.  Allergies:  Allergies  Allergen Reactions  . Estrogens Itching  . Nitrofurantoin     REACTION: severe reaction  . Penicillins     REACTION: as child  . Phenazopyridine Hcl Itching and Swelling    REACTION: unspecified  . Sulfamethoxazole-Trimethoprim Itching    REACTION: unspecified  . Ciprofloxacin Rash    REACTION: unspecified    Family History: Family History  Problem Relation Age of Onset  . Hyperlipidemia Mother   . Fibromyalgia Mother   . Osteoporosis Mother   . Hypertension Mother   . Hyperlipidemia Father   . Heart disease Father 18       MI, Tipple bypass surgery  . Asthma Son   . Allergies Son   . Breast cancer Maternal Grandmother     Social History:  reports that she has never smoked. She has never used smokeless tobacco.  She reports that she does not drink alcohol or use drugs.  ROS: UROLOGY Frequent Urination?: No Hard to postpone urination?: Yes Burning/pain with urination?: Yes Get up at night to urinate?: Yes Leakage of urine?: No Urine stream starts and stops?: No Trouble starting stream?: No Do you have to strain to urinate?: No Blood in urine?: No Urinary tract infection?: Yes Sexually transmitted disease?: No Injury to kidneys or bladder?: No Painful intercourse?: No Weak stream?: No Currently pregnant?: No Vaginal bleeding?: No Last menstrual period?: n  Gastrointestinal Nausea?: No Vomiting?: Yes Indigestion/heartburn?: No Diarrhea?: Yes Constipation?: No  Constitutional Fever: No Night sweats?: Yes Weight loss?: No Fatigue?: Yes  Skin Skin rash/lesions?: No Itching?: No  Eyes Blurred vision?: No Double vision?: No  Ears/Nose/Throat Sore throat?: No Sinus problems?: No  Hematologic/Lymphatic Swollen glands?: Yes Easy bruising?: No  Cardiovascular Leg swelling?: No Chest pain?: No  Respiratory Cough?: No Shortness of breath?: No  Endocrine Excessive thirst?: No  Musculoskeletal Back pain?: No Joint pain?: No  Neurological Headaches?: Yes Dizziness?: No  Psychologic Depression?: No Anxiety?: No  Physical Exam: BP 129/84 (BP Location: Left Arm, Patient Position: Sitting, Cuff Size: Normal)   Pulse 85   Ht 5\' 5"  (1.651 m)   Wt 174 lb 12.8 oz (79.3 kg)   BMI 29.09 kg/m   Constitutional:  Well nourished. Alert and oriented, No acute distress. HEENT: [place] AT, moist mucus membranes.  Trachea midline, no masses. Cardiovascular: No clubbing, cyanosis, or edema. Respiratory: Normal respiratory effort, no increased work of breathing. GI: Abdomen is soft, non tender, non distended, no abdominal masses. Liver and spleen not palpable.  No hernias appreciated.  Stool sample for occult testing is not indicated.   GU: No CVA tenderness.  No bladder fullness  or masses.  Normal external genitalia, normal pubic hair distribution, no lesions.  Normal urethral meatus, no lesions, no prolapse, no discharge.   No urethral masses, tenderness and/or tenderness. No bladder fullness, tenderness or masses. Normal vagina mucosa, good estrogen effect, no discharge, no lesions, poor pelvic support, grade I cystocele and no rectocele noted.  No cervical motion tenderness.  Uterus is freely mobile and non-fixed.  No adnexal/parametria masses or tenderness noted.  Anus and perineum are without rashes or lesions.    Skin: No rashes, bruises or suspicious lesions. Lymph: No cervical or inguinal adenopathy. Neurologic: Grossly intact, no focal deficits, moving all 4 extremities. Psychiatric: Normal mood and affect.  Laboratory Data: Lab Results  Component Value Date   WBC 7.6 04/20/2015   HGB 15.2 04/20/2015   HCT 45.1 04/20/2015   MCV 90.0 04/20/2015   PLT  283 04/20/2015    Lab Results  Component Value Date   CREATININE 0.62 04/20/2015    No results found for: PSA  No results found for: TESTOSTERONE  No results found for: HGBA1C  Lab Results  Component Value Date   TSH 3.73 07/19/2009       Component Value Date/Time   CHOL 169 11/20/2009 0905   HDL 43.90 11/20/2009 0905   CHOLHDL 4 11/20/2009 0905   VLDL 6.6 11/20/2009 0905   LDLCALC 119 (H) 11/20/2009 0905    Lab Results  Component Value Date   AST 15 04/24/2010   Lab Results  Component Value Date   ALT 11 04/24/2010   No components found for: ALKALINEPHOPHATASE No components found for: BILIRUBINTOTAL  No results found for: ESTRADIOL  Urinalysis Negative. See Epic.   I have reviewed the labs.   Pertinent Imaging: CLINICAL DATA:  Right flank and lower abdominal pain for 6 weeks.  EXAM: ABDOMEN - 1 VIEW  COMPARISON:  None.  FINDINGS: The bowel gas pattern is normal. No radio-opaque calculi or other significant radiographic abnormality are seen.  IMPRESSION: No  evidence of bowel obstruction or ileus.   Electronically Signed   By: Marijo Conception, M.D.   On: 12/02/2017 15:28 I have independently reviewed the films.    Assessment & Plan:    1. Dysuria Patient is instructed to increase their water intake until the urine is pale yellow or clear (10 to 12 cups daily)  Patient is instructed to drink cranberry juice Her UA is negative -will send for culture as patient states she is symptomatic I am not given an antibiotic at this time as culture results are not available  2. Right flank pain KUB did not demonstrate any stone Will obtain a RUS Patient to return for results   3. Mixed incontinence Mild bother at this time Given handout on Kegel exercise Patient is instructed to drink water discontinue her soda and sugary liquid intake                                  Return for RUS report .  These notes generated with voice recognition software. I apologize for typographical errors.  Zara Council, PA-C  [facility] [address]  [place], [place] [address] [phone]

## 2017-12-04 NOTE — Telephone Encounter (Signed)
Left detailed message for pt. Please arrange ultrasound and follow-up.

## 2017-12-06 LAB — CULTURE, URINE COMPREHENSIVE

## 2017-12-07 ENCOUNTER — Telehealth: Payer: Self-pay

## 2017-12-07 MED ORDER — LEVOFLOXACIN 500 MG PO TABS: 500.0000 mg | ORAL_TABLET | Freq: Every day | ORAL | 0 refills | Status: DC

## 2017-12-07 NOTE — Telephone Encounter (Signed)
Pt lmom stating that she had spoken to an assistant today and the discussion was completely different from the message that was left for her last week. She apologizes that she is just now getting message that was left on her vm last week and would like for someone to call her to clarify the 2 different issues. Pt did not leave details.

## 2017-12-07 NOTE — Telephone Encounter (Signed)
Patient notified , she states she is allergic to Septra caused itching she would like something else called in. Per Larene Beach ok to send in [facility] if patient is willing to try. There is a cross reaction since she has an allergy to Cipro. Patient verbalized understanding and is willing to try.

## 2017-12-07 NOTE — Telephone Encounter (Signed)
-----   Message from Nori Riis, PA-C sent at 12/06/2017  5:28 PM EDT ----- Please let Mrs. Wellborn know that she has a positive urine culture.  She needs to start Septra DS, 1 tablet twice daily for 7 days.

## 2017-12-08 NOTE — Telephone Encounter (Signed)
I have spoken with pt about her xray, ultrasound, and her positive culture. She states she now understands, will proceed with RUS. Please schedule and arrange follow-up for [facility].

## 2017-12-14 ENCOUNTER — Ambulatory Visit
Admission: RE | Admit: 2017-12-14 | Discharge: 2017-12-14 | Disposition: A | Discharge status: Home / Self Care | Payer: 59 | Source: Ambulatory Visit | Attending: Urology | Admitting: Urology

## 2017-12-14 DIAGNOSIS — R109 Unspecified abdominal pain: Secondary | ICD-10-CM | POA: Diagnosis present

## 2017-12-22 ENCOUNTER — Encounter: Payer: Self-pay | Admitting: Urology

## 2017-12-22 ENCOUNTER — Ambulatory Visit (INDEPENDENT_AMBULATORY_CARE_PROVIDER_SITE_OTHER): Payer: 59 | Admitting: Urology

## 2017-12-22 VITALS — BP 122/79 | HR 80 | Ht 65.0 in | Wt 176.6 lb

## 2017-12-22 DIAGNOSIS — N3000 Acute cystitis without hematuria: Secondary | ICD-10-CM

## 2017-12-22 DIAGNOSIS — R109 Unspecified abdominal pain: Secondary | ICD-10-CM | POA: Diagnosis not present

## 2017-12-22 DIAGNOSIS — N3946 Mixed incontinence: Secondary | ICD-10-CM | POA: Diagnosis not present

## 2017-12-22 NOTE — Progress Notes (Signed)
12/22/2017 8:44 AM   Connie [name] 13-Aug-1960 [id number]  Referring provider: Jinny Sanders, MD [address] [place], [place] [address]  Chief Complaint  Patient presents with  . Follow-up    HPI: Patient is a 57 year old Caucasian female with right flank pain, UTI and mixed incontinence who presents today to discuss renal ultrasound report and recheck.  Right flank pain Hx of nephrolithiasis - spontaneously passed 30 years ago.  RUS on 12/14/2017 revealed no stones, no hydronephrosis and no masses.  KUB on 12/02/2017 revealed no stones.  UTI's Risk factors for UTI: age, female gender, constipation, incontinence and sugary drinks.   Reviewing her records, she has had one documented positive urine culture + Enterobacter cloacae complex resistant to Augmentin, cefazolin and cefuroxime on 12/02/2017 + proteus mirabilis resistant to nitrofurantoin on 11/25/2017  Mixed incontinence Risk factors for incontinence: age, high caffeine intake and pelvic floor laxity.   PMH: Past Medical History:  Diagnosis Date  . Acid reflux 5 years  . H/O umbilical hernia repair 3016  . Hx of hysterectomy 2013  . Migraines   . Post partum depression 1986, 1998   W/both children - Tx with antidepressants for second child  . Reflux     Surgical History: Past Surgical History:  Procedure Laterality Date  . UMBILICAL HERNIA REPAIR  0109   No complications  . Vaginal births  1986, 1998    Home Medications:  Allergies as of 12/22/2017      Reactions   Estrogens Itching   Nitrofurantoin    REACTION: severe reaction   Penicillins    REACTION: as child   Phenazopyridine Hcl Itching, Swelling   REACTION: unspecified   Sulfamethoxazole-trimethoprim Itching   REACTION: unspecified   Ciprofloxacin Rash   REACTION: unspecified      Medication List        Accurate as of 12/22/17  8:44 AM. Always use your most recent med list.          ibuprofen 200 MG tablet Commonly  known as:  ADVIL,MOTRIN Take 200 mg by mouth as needed.       Allergies:  Allergies  Allergen Reactions  . Estrogens Itching  . Nitrofurantoin     REACTION: severe reaction  . Penicillins     REACTION: as child  . Phenazopyridine Hcl Itching and Swelling    REACTION: unspecified  . Sulfamethoxazole-Trimethoprim Itching    REACTION: unspecified  . Ciprofloxacin Rash    REACTION: unspecified    Family History: Family History  Problem Relation Age of Onset  . Hyperlipidemia Mother   . Fibromyalgia Mother   . Osteoporosis Mother   . Hypertension Mother   . Hyperlipidemia Father   . Heart disease Father 61       MI, Tipple bypass surgery  . Asthma Son   . Allergies Son   . Breast cancer Maternal Grandmother     Social History:  reports that she has never smoked. She has never used smokeless tobacco. She reports that she does not drink alcohol or use drugs.  ROS: UROLOGY Frequent Urination?: No Hard to postpone urination?: No Burning/pain with urination?: No Get up at night to urinate?: No Leakage of urine?: No Urine stream starts and stops?: No Trouble starting stream?: No Do you have to strain to urinate?: No Blood in urine?: No Urinary tract infection?: No Sexually transmitted disease?: No Injury to kidneys or bladder?: No Painful intercourse?: No Weak stream?: No Currently pregnant?: No Vaginal bleeding?:  No Last menstrual period?: n  Gastrointestinal Nausea?: No Vomiting?: No Indigestion/heartburn?: No Diarrhea?: No Constipation?: No  Constitutional Fever: No Night sweats?: No Weight loss?: No Fatigue?: No  Skin Skin rash/lesions?: No Itching?: No  Eyes Blurred vision?: No Double vision?: No  Ears/Nose/Throat Sore throat?: No Sinus problems?: No  Hematologic/Lymphatic Swollen glands?: No Easy bruising?: No  Cardiovascular Leg swelling?: No Chest pain?: No  Respiratory Cough?: No Shortness of breath?:  No  Endocrine Excessive thirst?: No  Musculoskeletal Back pain?: No Joint pain?: No  Neurological Headaches?: No Dizziness?: No  Psychologic Depression?: No Anxiety?: No  Physical Exam: BP 122/79 (BP Location: Left Arm, Patient Position: Sitting, Cuff Size: Normal)   Pulse 80   Ht 5\' 5"  (1.651 m)   Wt 176 lb 9.6 oz (80.1 kg)   BMI 29.39 kg/m   Constitutional: Well nourished. Alert and oriented, No acute distress. HEENT: [place] AT, moist mucus membranes. Trachea midline, no masses. Cardiovascular: No clubbing, cyanosis, or edema. Respiratory: Normal respiratory effort, no increased work of breathing. Skin: No rashes, bruises or suspicious lesions. Lymph: No cervical or inguinal adenopathy. Neurologic: Grossly intact, no focal deficits, moving all 4 extremities. Psychiatric: Normal mood and affect.  Laboratory Data: Lab Results  Component Value Date   WBC 7.6 04/20/2015   HGB 15.2 04/20/2015   HCT 45.1 04/20/2015   MCV 90.0 04/20/2015   PLT 283 04/20/2015    Lab Results  Component Value Date   CREATININE 0.62 04/20/2015    No results found for: PSA  No results found for: TESTOSTERONE  No results found for: HGBA1C  Lab Results  Component Value Date   TSH 3.73 07/19/2009       Component Value Date/Time   CHOL 169 11/20/2009 0905   HDL 43.90 11/20/2009 0905   CHOLHDL 4 11/20/2009 0905   VLDL 6.6 11/20/2009 0905   LDLCALC 119 (H) 11/20/2009 0905    Lab Results  Component Value Date   AST 15 04/24/2010   Lab Results  Component Value Date   ALT 11 04/24/2010   No components found for: ALKALINEPHOPHATASE No components found for: BILIRUBINTOTAL  No results found for: ESTRADIOL  I have reviewed the labs.   Pertinent Imaging: CLINICAL DATA:  Right flank pain  EXAM: RENAL / URINARY TRACT ULTRASOUND COMPLETE  COMPARISON:  None.  FINDINGS: Right Kidney:  Length: 11 cm. Echogenicity within normal limits. No mass or hydronephrosis  visualized.  Left Kidney:  Length: 10.1 cm. Echogenicity within normal limits. No mass or hydronephrosis visualized.  Bladder:  Appears normal for degree of bladder distention.  IMPRESSION: Negative renal ultrasound   Electronically Signed   By: Donavan Foil M.D.   On: 12/14/2017 15:25  I have independently reviewed the films with the patient  Assessment & Plan:    1. UTI Patient has made good strides in reducing her soda intake and increasing her water intake She is currently asymptomatic Patient will contact office if she should have feelings of urinary tract infection  2. Right flank pain Resolved RUS was negative   3. Mixed incontinence Working on becoming more consistent with Kegel exercises                                  Return if symptoms worsen or fail to improve.  These notes generated with voice recognition software. I apologize for typographical errors.  Royden Purl  [place]  [address]  [place], [place] [address] [phone]

## 2017-12-28 ENCOUNTER — Ambulatory Visit: Payer: Self-pay | Admitting: Urology

## 2018-05-02 ENCOUNTER — Emergency Department (HOSPITAL_COMMUNITY)
Admission: EM | Admit: 2018-05-02 | Discharge: 2018-05-02 | Disposition: A | Discharge status: Home / Self Care | Payer: 59 | Attending: Emergency Medicine | Admitting: Emergency Medicine

## 2018-05-02 ENCOUNTER — Encounter (HOSPITAL_COMMUNITY): Payer: Self-pay | Admitting: Emergency Medicine

## 2018-05-02 ENCOUNTER — Emergency Department (HOSPITAL_COMMUNITY): Payer: 59

## 2018-05-02 ENCOUNTER — Other Ambulatory Visit: Payer: Self-pay

## 2018-05-02 DIAGNOSIS — R51 Headache: Secondary | ICD-10-CM | POA: Insufficient documentation

## 2018-05-02 DIAGNOSIS — K2900 Acute gastritis without bleeding: Secondary | ICD-10-CM

## 2018-05-02 DIAGNOSIS — J181 Lobar pneumonia, unspecified organism: Secondary | ICD-10-CM | POA: Insufficient documentation

## 2018-05-02 DIAGNOSIS — Z79899 Other long term (current) drug therapy: Secondary | ICD-10-CM | POA: Insufficient documentation

## 2018-05-02 DIAGNOSIS — R42 Dizziness and giddiness: Secondary | ICD-10-CM

## 2018-05-02 DIAGNOSIS — R079 Chest pain, unspecified: Secondary | ICD-10-CM | POA: Diagnosis present

## 2018-05-02 DIAGNOSIS — J189 Pneumonia, unspecified organism: Secondary | ICD-10-CM

## 2018-05-02 LAB — COMPREHENSIVE METABOLIC PANEL
ALT: 22 U/L (ref 0–44)
AST: 20 U/L (ref 15–41)
Albumin: 4.1 g/dL (ref 3.5–5.0)
Alkaline Phosphatase: 60 U/L (ref 38–126)
Anion gap: 12 (ref 5–15)
BUN: 11 mg/dL (ref 6–20)
CO2: 23 mmol/L (ref 22–32)
Calcium: 9.4 mg/dL (ref 8.9–10.3)
Chloride: 103 mmol/L (ref 98–111)
Creatinine, Ser: 0.78 mg/dL (ref 0.44–1.00)
GFR calc Af Amer: >60 mL/min (ref >60)
GFR calc non Af Amer: >60 mL/min (ref >60)
Glucose, Bld: 100 mg/dL — ABNORMAL HIGH (ref 70–99)
Potassium: 3.6 mmol/L (ref 3.5–5.1)
Sodium: 138 mmol/L (ref 135–145)
Total Bilirubin: 0.8 mg/dL (ref 0.3–1.2)
Total Protein: 7.2 g/dL (ref 6.5–8.1)

## 2018-05-02 LAB — I-STAT TROPONIN, ED
TROPONIN I, POC: 0.01 ng/mL (ref 0.00–0.08)
Troponin i, poc: 0.02 ng/mL (ref 0.00–0.08)

## 2018-05-02 LAB — CBC
HCT: 47.3 % — ABNORMAL HIGH (ref 36.0–46.0)
Hemoglobin: 15.7 g/dL — ABNORMAL HIGH (ref 12.0–15.0)
MCH: 30.3 pg (ref 26.0–34.0)
MCHC: 33.2 g/dL (ref 30.0–36.0)
MCV: 91.3 fL (ref 80.0–100.0)
NRBC: 0 % (ref 0.0–0.2)
PLATELETS: 331 10*3/uL (ref 150–400)
RBC: 5.18 MIL/uL — AB (ref 3.87–5.11)
RDW: 12.6 % (ref 11.5–15.5)
WBC: 6.1 10*3/uL (ref 4.0–10.5)

## 2018-05-02 MED ORDER — MECLIZINE HCL 25 MG PO TABS: 25.0000 mg | ORAL_TABLET | Freq: Three times a day (TID) | ORAL | 0 refills | Status: DC | PRN

## 2018-05-02 MED ORDER — DOXYCYCLINE HYCLATE 100 MG PO CAPS: 100.0000 mg | ORAL_CAPSULE | Freq: Two times a day (BID) | ORAL | 0 refills | Status: DC

## 2018-05-02 MED ORDER — LIDOCAINE VISCOUS HCL 2 % MT SOLN
15.0000 mL | Freq: Once | OROMUCOSAL | Status: AC
  Administered 2018-05-02: 15 mL via ORAL
  Filled 2018-05-02: qty 15

## 2018-05-02 MED ORDER — MECLIZINE HCL 25 MG PO TABS
25.0000 mg | ORAL_TABLET | Freq: Once | ORAL | Status: AC
  Administered 2018-05-02: 25 mg via ORAL
  Filled 2018-05-02: qty 1

## 2018-05-02 MED ORDER — HYOSCYAMINE SULFATE 0.125 MG SL SUBL
0.2500 mg | SUBLINGUAL_TABLET | Freq: Once | SUBLINGUAL | Status: AC
  Administered 2018-05-02: 0.25 mg via SUBLINGUAL
  Filled 2018-05-02: qty 2

## 2018-05-02 MED ORDER — FAMOTIDINE 20 MG PO TABS: 20.0000 mg | ORAL_TABLET | Freq: Two times a day (BID) | ORAL | 0 refills | Status: DC

## 2018-05-02 MED ORDER — ALUM & MAG HYDROXIDE-SIMETH 200-200-20 MG/5ML PO SUSP
30.0000 mL | Freq: Once | ORAL | Status: AC
  Administered 2018-05-02: 30 mL via ORAL
  Filled 2018-05-02: qty 30

## 2018-05-02 NOTE — ED Triage Notes (Signed)
Pt reports chest pain that radiates to back x83minutes. No hx of DVT or PE

## 2018-05-02 NOTE — ED Notes (Signed)
Pt ambulated to bathroom with slow and steady gait.

## 2018-05-02 NOTE — ED Provider Notes (Signed)
[facility] DEPARTMENT Provider Note   CSN: [id number] Arrival date & time: 05/02/18  1130     History   Chief Complaint Chief Complaint  Patient presents with  . Chest Pain    HPI Connie [name] is a 57 y.o. female Connie [name] presents to the ED with cc of CP. Patient was at church today when she had onset of Retrosternal and left sided CP with associated pressure and nausea. The pain is pressure like and burning. Radiates to the back. No paresthesia, sob, vomiting or diaphoresis. Patient has been taking daily motrin for the past week due to recent Flu. She has a hx of reflux. She denies hx of smoking, HTN, HLD, DM, or FHX of premature CAD. Patient hasa hx of migraine headches. At onset of her sxs today she developed a global headache and sensation of room spinning and dysequilibrium. She has no hx of vertigo and has no ataxia. SHe has not other neurological complaints.   HPI  Past Medical History:  Diagnosis Date  . Acid reflux 5 years  . H/O umbilical hernia repair 5400  . Hx of hysterectomy 2013  . Migraines   . Post partum depression 1986, 1998   W/both children - Tx with antidepressants for second child  . Reflux     Patient Active Problem List   Diagnosis Date Noted  . Recurrent UTI 11/25/2017  . FIBROIDS, UTERUS 01/25/2010  . ANEMIA, IRON DEFICIENCY 11/23/2009  . MENORRHAGIA 11/23/2009  . LIBIDO, DECREASED 11/23/2009  . LEG CRAMPS, NOCTURNAL 07/19/2009  . INSOMNIA 07/19/2009  . DEPRESSION 05/28/2007  . Migraine without aura 05/28/2007  . GERD 05/28/2007    Past Surgical History:  Procedure Laterality Date  . UMBILICAL HERNIA REPAIR  8676   No complications  . Vaginal births  1986, 1998     OB History   No obstetric history on file.      Home Medications    Prior to Admission medications   Medication Sig Start Date End Date Taking? Authorizing Provider  ibuprofen (ADVIL,MOTRIN) 200 MG tablet Take 200 mg by mouth as needed.       [provider]    Family History Family History  Problem Relation Age of Onset  . Hyperlipidemia Mother   . Fibromyalgia Mother   . Osteoporosis Mother   . Hypertension Mother   . Hyperlipidemia Father   . Heart disease Father 45       MI, Tipple bypass surgery  . Asthma Son   . Allergies Son   . Breast cancer Maternal Grandmother     Social History Social History   Tobacco Use  . Smoking status: Never Smoker  . Smokeless tobacco: Never Used  Substance Use Topics  . Alcohol use: No  . Drug use: No     Allergies   Estrogens; Nitrofurantoin; Penicillins; Phenazopyridine hcl; Sulfamethoxazole-trimethoprim; and Ciprofloxacin   Review of Systems Review of Systems Ten systems reviewed and are negative for acute change, except as noted in the HPI.    Physical Exam Updated Vital Signs BP 138/76   Temp 98.1 F (36.7 C) (Oral)   Resp 17   Ht 5\' 5"  (1.651 m)   Wt 79.4 kg   SpO2 98%   BMI 29.12 kg/m   Physical Exam Vitals signs and nursing note reviewed.  Constitutional:      General: She is not in acute distress.    Appearance: She is well-developed. She is not diaphoretic.  HENT:  Head: Normocephalic and atraumatic.  Eyes:     General: No scleral icterus.    Conjunctiva/sclera: Conjunctivae normal.     Pupils: Pupils are equal, round, and reactive to light.     Comments: No horizontal, vertical or rotational nystagmus  Neck:     Musculoskeletal: Normal range of motion and neck supple.     Comments: Full active and passive ROM without pain No midline or paraspinal tenderness No nuchal rigidity or meningeal signs Cardiovascular:     Rate and Rhythm: Normal rate and regular rhythm.  Pulmonary:     Effort: Pulmonary effort is normal. No respiratory distress.     Breath sounds: Normal breath sounds. No wheezing or rales.  Abdominal:     General: Bowel sounds are normal.     Palpations: Abdomen is soft.     Tenderness: There is no abdominal  tenderness. There is no guarding or rebound.  Musculoskeletal: Normal range of motion.  Lymphadenopathy:     Cervical: No cervical adenopathy.  Skin:    General: Skin is warm and dry.     Findings: No rash.  Neurological:     Mental Status: She is alert and oriented to person, place, and time.     Cranial Nerves: No cranial nerve deficit.     Motor: No abnormal muscle tone.     Coordination: Coordination normal.     Comments: Mental Status:  Alert, oriented, thought content appropriate. Speech fluent without evidence of aphasia. Able to follow 2 step commands without difficulty.  Cranial Nerves:  II:  Peripheral visual fields grossly normal, pupils equal, round, reactive to light III,IV, VI: ptosis not present, extra-ocular motions intact bilaterally  V,VII: smile symmetric, facial light touch sensation equal VIII: hearing grossly normal bilaterally  IX,X: midline uvula rise  XI: bilateral shoulder shrug equal and strong XII: midline tongue extension  Motor:  5/5 in upper and lower extremities bilaterally including strong and equal grip strength and dorsiflexion/plantar flexion Sensory: Pinprick and light touch normal in all extremities.  Cerebellar: normal finger-to-nose with bilateral upper extremities Gait: normal gait and balance CV: distal pulses palpable throughout   Psychiatric:        Behavior: Behavior normal.        Thought Content: Thought content normal.        Judgment: Judgment normal.      ED Treatments / Results  Labs (all labs ordered are listed, but only abnormal results are displayed) Labs Reviewed  CBC - Abnormal; Notable for the following components:      Result Value   RBC 5.18 (*)    Hemoglobin 15.7 (*)    HCT 47.3 (*)    All other components within normal limits  COMPREHENSIVE METABOLIC PANEL - Abnormal; Notable for the following components:   Glucose, Bld 100 (*)    All other components within normal limits  I-STAT TROPONIN, ED    EKG EKG  Interpretation  Date/Time:  Sunday May 02 2018 11:40:23 EST Ventricular Rate:  102 PR Interval:    QRS Duration: 84 QT Interval:  348 QTC Calculation: 454 R Axis:   68 Text Interpretation:  Sinus tachycardia Probable left atrial enlargement Baseline wander in lead(s) V6 No prior ECG for comparison.  No STEMI Confirmed by Antony Blackbird [phone]) on 05/02/2018 12:10:10 PM Also confirmed by Antony Blackbird [phone]), editor Radene Gunning [phone])  on 05/02/2018 12:49:47 PM   Radiology No results found.  Procedures Procedures (including critical care time)  Medications Ordered in ED  Medications  meclizine (ANTIVERT) tablet 25 mg (25 mg Oral Given 05/02/18 1224)  alum & mag hydroxide-simeth (MAALOX/MYLANTA) 200-200-20 MG/5ML suspension 30 mL (30 mLs Oral Given 05/02/18 1228)    And  lidocaine (XYLOCAINE) 2 % viscous mouth solution 15 mL (15 mLs Oral Given 05/02/18 1228)  hyoscyamine (LEVSIN SL) SL tablet 0.25 mg (0.25 mg Sublingual Given 05/02/18 1224)     Initial Impression / Assessment and Plan / ED Course  I have reviewed the triage vital signs and the nursing notes.  Pertinent labs & imaging results that were available during my care of the patient were reviewed by me and considered in my medical decision making (see chart for details).     58 year old female who presents the emergency department with chief complaint of chest pain.The emergent differential diagnosis of chest pain includes: Acute coronary syndrome, pericarditis, aortic dissection, pulmonary embolism, tension pneumothorax, pneumonia, and esophageal rupture. Patient also had onset of vertigo.  Patient had no objective or focal neurologic deficits on neuro exam.  CT scan of the head shows no acute abnormalities.  Her dizziness seems positional in nature and improved with rest.  She has had complete resolution of her dizziness after meclizine.  I have very low suspicion that there is a central cause such as posterior  circulation stroke.  The patient also had chest pain.  I have very low suspicion for ACS.  She has a heart score of 3 making her low risk for major adverse cardiac events.  She has 2- troponins over time.  I have also extremely low suspicion that the patient has a dissection of the thoracic aortic.  Although patient does complain of chest pain that radiates to her back this was only after prompting the patient of potential points of radiation and her specific answer was "well I do kind of feel something in my back."  Patient also had reproducible muscular tenderness which I believe is the cause of her pain and discomfort for in her upper back.  The patient feels extremely well improved here in the emergency department.  She is not tachycardic, hypotensive or hypoxic.  I have low suspicion for pulmonary embolus.  Her chest x-ray does show left lower lobe linear pneumonia and I believe this is the cause of her symptoms given her recent flulike illness.  Will be started on doxycycline.  She will be given meclizine for her vertiginous symptoms which have resolved just in case it returns.  Patient case discussed with Dr. Sherry Ruffing who agrees with work-up and plan for discharge at this time.  Patient should have close outpatient follow-up in the next 7 to 10 days with her PCP  Final Clinical Impressions(s) / ED Diagnoses   Final diagnoses:  None    ED Discharge Orders    None       Margarita Mail, PA-C 05/04/18 1935    Tegeler, Gwenyth Allegra, MD 05/04/18 [phone]

## 2018-05-02 NOTE — Discharge Instructions (Addendum)
Contact a health care provider if: You have a fever. You are losing sleep because you cannot control your cough with cough medicine. Get help right away if: You have worsening shortness of breath. You have increased chest pain. Your sickness becomes worse, especially if you are an older adult or have a weakened immune system. You cough up blood. Get help right away if: You have difficulty moving or speaking. You are always dizzy. You faint. You develop severe headaches. You have weakness in your hands, arms, or legs. You have changes in your hearing or vision. You develop a stiff neck. You develop sensitivity to light.

## 2018-05-07 ENCOUNTER — Telehealth: Payer: Self-pay | Admitting: Family Medicine

## 2018-05-07 NOTE — Telephone Encounter (Signed)
Pt stated she was diagnosed with Pneumonia and was told she need a f/u chest x-ray. Pt want to know can she come and get it done before her appt. Please advise pt

## 2018-05-07 NOTE — Telephone Encounter (Signed)
Ms. Siegmann notified as instructed by telephone.  Patient states she is about to finish her antibiotics but still feels very fatigue and has some pain in her back around lung area.  She wanted to know if this is very typical.  I advised this is very typical with pneumonia.  I advised to make sure she keeps her follow up next week 05/14/2018 at 9:45 am with Dr. Diona Browner.  Patient states understanding.

## 2018-05-07 NOTE — Telephone Encounter (Signed)
She can come in for follow up, I will reassess her...  But we typically do not repeat CXR immediately after given changes can persist >2 weeks. We can discuss at appt.

## 2018-05-14 ENCOUNTER — Encounter: Payer: Self-pay | Admitting: Family Medicine

## 2018-05-14 ENCOUNTER — Ambulatory Visit (INDEPENDENT_AMBULATORY_CARE_PROVIDER_SITE_OTHER): Payer: 59 | Admitting: Family Medicine

## 2018-05-14 DIAGNOSIS — G43009 Migraine without aura, not intractable, without status migrainosus: Secondary | ICD-10-CM | POA: Diagnosis not present

## 2018-05-14 DIAGNOSIS — K219 Gastro-esophageal reflux disease without esophagitis: Secondary | ICD-10-CM | POA: Diagnosis not present

## 2018-05-14 DIAGNOSIS — J181 Lobar pneumonia, unspecified organism: Secondary | ICD-10-CM

## 2018-05-14 DIAGNOSIS — J189 Pneumonia, unspecified organism: Secondary | ICD-10-CM

## 2018-05-14 DIAGNOSIS — Z8701 Personal history of pneumonia (recurrent): Secondary | ICD-10-CM | POA: Insufficient documentation

## 2018-05-14 NOTE — Progress Notes (Signed)
Subjective:    Patient ID: Connie [name], female    DOB: Aug 21, 1960, 58 y.o.   MRN: [id number]  HPI   58 year old female presents for hospital ER follow up for PNA. Seen in ER on 2/16 with chest pain, headache and vertigo  CT scan of the head shows no acute abnormalities.  Her dizziness seems positional in nature and improved with rest. Her chest x-ray  showed left lower lobe linear pneumonia. Treated with meclizine and doxycycline x 7 days.  Told not to use ibuprofen gicen chest pain may in part be GERD.Marland Kitchen was taking more.  Today she reports she feels much better. She is weak still but getting better.  Stamina increasing overtime.  No SOB, no wheeze, no fever. No congestion and cough.  good po intake, drinking water.  Occ having weight on shoulders, tightness in upper chest at time, notes more when she is tired. No chest pain. She is doing yoga to release tension which has been helping.   Needs a med to take for acute migraine. 1 migraine per month. Weather and menses related.  tylenol and excedrine cause rash in past.  SE Imitrex.   Nonsmoker. No history of asthma. Due for CPX. Blood pressure 112/84, pulse 95, temperature 98.5 F (36.9 C), temperature source Oral, height 5\' 5"  (1.651 m), weight 179 lb 8 oz (81.4 kg), SpO2 98 %. Social History /Family History/Past Medical History reviewed in detail and updated in EMR if needed.   Review of Systems  Constitutional: Negative for fatigue and fever.  HENT: Negative for congestion.   Eyes: Negative for pain.  Respiratory: Negative for cough and shortness of breath.   Cardiovascular: Negative for chest pain, palpitations and leg swelling.  Gastrointestinal: Negative for abdominal pain.  Genitourinary: Negative for dysuria and vaginal bleeding.  Musculoskeletal: Negative for back pain.  Neurological: Negative for syncope, light-headedness and headaches.  Psychiatric/Behavioral: Negative for dysphoric mood.         Objective:   Physical Exam Constitutional:      General: She is not in acute distress.    Appearance: Normal appearance. She is well-developed. She is not ill-appearing or toxic-appearing.  HENT:     Head: Normocephalic.     Right Ear: Hearing, tympanic membrane, ear canal and external ear normal. Tympanic membrane is not erythematous, retracted or bulging.     Left Ear: Hearing, tympanic membrane, ear canal and external ear normal. Tympanic membrane is not erythematous, retracted or bulging.     Nose: No mucosal edema or rhinorrhea.     Right Sinus: No maxillary sinus tenderness or frontal sinus tenderness.     Left Sinus: No maxillary sinus tenderness or frontal sinus tenderness.     Mouth/Throat:     Pharynx: Uvula midline.  Eyes:     General: Lids are normal. Lids are everted, no foreign bodies appreciated.     Conjunctiva/sclera: Conjunctivae normal.     Pupils: Pupils are equal, round, and reactive to light.  Neck:     Musculoskeletal: Normal range of motion and neck supple.     Thyroid: No thyroid mass or thyromegaly.     Vascular: No carotid bruit.     Trachea: Trachea normal.  Cardiovascular:     Rate and Rhythm: Normal rate and regular rhythm.     Pulses: Normal pulses.     Heart sounds: Normal heart sounds, S1 normal and S2 normal. No murmur. No friction rub. No gallop.   Pulmonary:  Effort: Pulmonary effort is normal. No tachypnea or respiratory distress.     Breath sounds: Normal breath sounds. No decreased breath sounds, wheezing, rhonchi or rales.  Abdominal:     General: Bowel sounds are normal.     Palpations: Abdomen is soft.     Tenderness: There is no abdominal tenderness.  Skin:    General: Skin is warm and dry.     Findings: No rash.  Neurological:     Mental Status: She is alert.  Psychiatric:        Mood and Affect: Mood is not anxious or depressed.        Speech: Speech normal.        Behavior: Behavior normal. Behavior is cooperative.         Thought Content: Thought content normal.        Judgment: Judgment normal.           Assessment & Plan:

## 2018-05-14 NOTE — Patient Instructions (Signed)
Can use ibuprofen limitedly for migraine.. but use with OTC Prilosec 20 mg daily.  Avoid spicy foods, acidic foods, like tomatos, citris, peppermint, soda, caffeine, alcohol.

## 2018-05-14 NOTE — Assessment & Plan Note (Signed)
SE to triptans, tylenol excedrine  Can use ibuprofen with PPI in limited fashion... only occurring 1 time per month.

## 2018-05-14 NOTE — Assessment & Plan Note (Signed)
Resolved s/p antibiotics. 

## 2018-05-14 NOTE — Assessment & Plan Note (Signed)
May be contributing to chest tightness.. was taking more ibuprofen when sick. Stop NSAIDS... but can use with PPI in limited fashion as these are only thing she can tolerate for migraine.  Trigger avoidance.

## 2018-06-03 ENCOUNTER — Encounter: Payer: Self-pay | Admitting: *Deleted

## 2018-06-03 ENCOUNTER — Telehealth: Payer: Self-pay

## 2018-06-03 NOTE — Telephone Encounter (Signed)
Covid 19 questionnaire sent to patient via MyChart.  [facility] notified by telephone to please complete the questionnaire on her MyChart and once Dr. Diona Browner reviews her responses, when will decide next steps.  Patient states understanding.

## 2018-06-03 NOTE — Telephone Encounter (Signed)
Thamas Jaegers questionnaire reviewed.  Pt low risk for COVID but needs to be seen for possible flu/ pneumonia.   Please call pt to set up pt today or tommorow. Write neg COVIDScreening on schedule note

## 2018-06-03 NOTE — Telephone Encounter (Signed)
At follow up 2/28 pneumonia had clinically resolved. This is likely a new issue/infection.  Please  Call and eval with COVID screen.  If low risk have her make appt to be seen and re-evaluated.

## 2018-06-03 NOTE — Telephone Encounter (Signed)
Please see note on MYchart questionnaire.

## 2018-06-03 NOTE — Telephone Encounter (Signed)
Pt was diagnosed with pneumonia last month. Within the last few days, she has started with chills, chest tightness, fatigue, pain between her shoulder blades, swollen glands under her arms and breasts. Asking what she should do. Uses [facility].

## 2018-06-04 ENCOUNTER — Encounter: Payer: Self-pay | Admitting: Family Medicine

## 2018-06-04 ENCOUNTER — Other Ambulatory Visit: Payer: Self-pay

## 2018-06-04 ENCOUNTER — Ambulatory Visit (INDEPENDENT_AMBULATORY_CARE_PROVIDER_SITE_OTHER): Payer: 59 | Admitting: Family Medicine

## 2018-06-04 VITALS — BP 122/66 | HR 92 | Temp 98.2°F | Ht 65.0 in | Wt 178.5 lb

## 2018-06-04 DIAGNOSIS — R52 Pain, unspecified: Secondary | ICD-10-CM

## 2018-06-04 DIAGNOSIS — Z8701 Personal history of pneumonia (recurrent): Secondary | ICD-10-CM

## 2018-06-04 DIAGNOSIS — R350 Frequency of micturition: Secondary | ICD-10-CM | POA: Diagnosis not present

## 2018-06-04 LAB — POC URINALSYSI DIPSTICK (AUTOMATED)
Bilirubin, UA: NEGATIVE
Glucose, UA: NEGATIVE
Ketones, UA: NEGATIVE
Nitrite, UA: NEGATIVE
PROTEIN UA: NEGATIVE
SPEC GRAV UA: 1.01 (ref 1.010–1.025)
Urobilinogen, UA: 0.2 E.U./dL
pH, UA: 6.5 (ref 5.0–8.0)

## 2018-06-04 LAB — POC INFLUENZA A&B (BINAX/QUICKVUE)
INFLUENZA B, POC: NEGATIVE
Influenza A, POC: NEGATIVE

## 2018-06-04 LAB — POCT UA - MICROSCOPIC ONLY

## 2018-06-04 NOTE — Patient Instructions (Addendum)
Rest, fluids.  Tylenol for body ache.  Your urine is clear.  Call if shortness of breath or worsening symptoms.  Viral infections can last 10-14 days.

## 2018-06-04 NOTE — Progress Notes (Signed)
Subjective:    Patient ID: Connie [name], female    DOB: 02-26-1961, 58 y.o.   MRN: [id number]  HPI  58 year old female with history of  PNA ( got well x 3 weeks)  In 2019 presents with new onset  Chills, fatigue and chest tightness. She reports she started with symptoms in last week. Soreness under arms, fatigue. No cough. NO SOB. Has tightness in chest, at rest and exertion.  Last few days sore throat.  No fever.  General ache. Feels swollen in chest and breasts in last week.   mammogram not done in last few years.. has scheduled next month.   Exposed to flu last week at office.  Neg COVID19 screening. Blood pressure 122/66, pulse 92, temperature 98.2 F (36.8 C), temperature source Oral, height 5\' 5"  (1.651 m), weight 178 lb 8 oz (81 kg), SpO2 97 %.  Social History /Family History/Past Medical History reviewed in detail and updated in EMR if needed.  Review of Systems  Constitutional: Positive for fatigue. Negative for fever.  HENT: Negative for congestion.   Eyes: Negative for pain.  Respiratory: Positive for cough. Negative for shortness of breath.   Cardiovascular: Positive for chest pain. Negative for palpitations and leg swelling.  Gastrointestinal: Negative for abdominal pain.  Genitourinary: Negative for dysuria and vaginal bleeding.  Musculoskeletal: Negative for back pain.  Neurological: Negative for syncope, light-headedness and headaches.  Psychiatric/Behavioral: Negative for dysphoric mood.       Objective:   Physical Exam Constitutional:      General: She is not in acute distress.    Appearance: She is well-developed. She is not ill-appearing or toxic-appearing.  HENT:     Head: Normocephalic.     Right Ear: Hearing, tympanic membrane, ear canal and external ear normal. Tympanic membrane is not erythematous, retracted or bulging.     Left Ear: Hearing, tympanic membrane, ear canal and external ear normal. Tympanic membrane is not erythematous,  retracted or bulging.     Nose: Mucosal edema and rhinorrhea present.     Right Sinus: No maxillary sinus tenderness or frontal sinus tenderness.     Left Sinus: No maxillary sinus tenderness or frontal sinus tenderness.     Mouth/Throat:     Pharynx: Uvula midline.  Eyes:     General: Lids are normal. Lids are everted, no foreign bodies appreciated.     Conjunctiva/sclera: Conjunctivae normal.     Pupils: Pupils are equal, round, and reactive to light.  Neck:     Musculoskeletal: Normal range of motion and neck supple.     Thyroid: No thyroid mass or thyromegaly.     Vascular: No carotid bruit.     Trachea: Trachea normal.  Cardiovascular:     Rate and Rhythm: Normal rate and regular rhythm.     Pulses: Normal pulses.     Heart sounds: Normal heart sounds, S1 normal and S2 normal. No murmur. No friction rub. No gallop.   Pulmonary:     Effort: Pulmonary effort is normal. No tachypnea or respiratory distress.     Breath sounds: Normal breath sounds. No decreased breath sounds, wheezing, rhonchi or rales.  Chest:     Breasts: Breasts are symmetrical.        Right: Normal.        Left: Normal.  Lymphadenopathy:     Upper Body:     Right upper body: No supraclavicular, axillary or pectoral adenopathy.     Left upper body: No  supraclavicular, axillary or pectoral adenopathy.  Skin:    General: Skin is warm and dry.     Findings: No rash.  Neurological:     Mental Status: She is alert.  Psychiatric:        Mood and Affect: Mood is not anxious or depressed.        Speech: Speech normal.        Behavior: Behavior normal. Behavior is cooperative.        Judgment: Judgment normal.           Assessment & Plan:   Orders Placed This Encounter  Procedures  . POC Influenza A&B(BINAX/QUICKVUE)  . POCT Urinalysis Dipstick (Automated)  . POCT UA - Microscopic Only

## 2018-06-10 ENCOUNTER — Other Ambulatory Visit: Payer: 59

## 2018-06-10 DIAGNOSIS — R350 Frequency of micturition: Secondary | ICD-10-CM | POA: Insufficient documentation

## 2018-06-10 DIAGNOSIS — R52 Pain, unspecified: Secondary | ICD-10-CM | POA: Insufficient documentation

## 2018-06-10 NOTE — Assessment & Plan Note (Signed)
UA clear.

## 2018-06-10 NOTE — Assessment & Plan Note (Addendum)
Nml breast exam, no lymphadenopathy.    Possible chest ache due to myalgia from possible viral syndrome. Flu test in office negative.  Low risk for COVID19  Symptomatic care.

## 2018-06-10 NOTE — Assessment & Plan Note (Signed)
Clear lung exam. No current red flags for PNA recurrence.

## 2018-06-17 ENCOUNTER — Encounter: Payer: 59 | Admitting: Family Medicine

## 2018-07-07 LAB — HM MAMMOGRAPHY

## 2018-07-30 ENCOUNTER — Other Ambulatory Visit: Payer: Self-pay

## 2018-07-30 ENCOUNTER — Ambulatory Visit (INDEPENDENT_AMBULATORY_CARE_PROVIDER_SITE_OTHER): Payer: 59 | Admitting: Family Medicine

## 2018-07-30 ENCOUNTER — Encounter: Payer: Self-pay | Admitting: Family Medicine

## 2018-07-30 ENCOUNTER — Other Ambulatory Visit (INDEPENDENT_AMBULATORY_CARE_PROVIDER_SITE_OTHER): Payer: 59

## 2018-07-30 VITALS — Ht 65.0 in

## 2018-07-30 DIAGNOSIS — H538 Other visual disturbances: Secondary | ICD-10-CM

## 2018-07-30 DIAGNOSIS — G4700 Insomnia, unspecified: Secondary | ICD-10-CM

## 2018-07-30 DIAGNOSIS — G43009 Migraine without aura, not intractable, without status migrainosus: Secondary | ICD-10-CM

## 2018-07-30 LAB — CBC WITH DIFFERENTIAL/PLATELET
Basophils Absolute: 0.1 10*3/uL (ref 0.0–0.1)
Basophils Relative: 0.9 % (ref 0.0–3.0)
Eosinophils Absolute: 0.2 10*3/uL (ref 0.0–0.7)
Eosinophils Relative: 4 % (ref 0.0–5.0)
HCT: 46.1 % — ABNORMAL HIGH (ref 36.0–46.0)
Hemoglobin: 15.8 g/dL — ABNORMAL HIGH (ref 12.0–15.0)
Lymphocytes Relative: 17.4 % (ref 12.0–46.0)
Lymphs Abs: 1.1 10*3/uL (ref 0.7–4.0)
MCHC: 34.2 g/dL (ref 30.0–36.0)
MCV: 92 fl (ref 78.0–100.0)
Monocytes Absolute: 0.4 10*3/uL (ref 0.1–1.0)
Monocytes Relative: 5.9 % (ref 3.0–12.0)
Neutro Abs: 4.5 10*3/uL (ref 1.4–7.7)
Neutrophils Relative %: 71.8 % (ref 43.0–77.0)
Platelets: 291 10*3/uL (ref 150.0–400.0)
RBC: 5.01 Mil/uL (ref 3.87–5.11)
RDW: 13.4 % (ref 11.5–15.5)
WBC: 6.2 10*3/uL (ref 4.0–10.5)

## 2018-07-30 LAB — COMPREHENSIVE METABOLIC PANEL
ALT: 20 U/L (ref 0–35)
AST: 21 U/L (ref 0–37)
Albumin: 4.3 g/dL (ref 3.5–5.2)
Alkaline Phosphatase: 67 U/L (ref 39–117)
BUN: 11 mg/dL (ref 6–23)
CO2: 26 mEq/L (ref 19–32)
Calcium: 9.4 mg/dL (ref 8.4–10.5)
Chloride: 102 mEq/L (ref 96–112)
Creatinine, Ser: 0.71 mg/dL (ref 0.40–1.20)
GFR: 85.1 mL/min (ref >60.00)
Glucose, Bld: 99 mg/dL (ref 70–99)
Potassium: 3.7 mEq/L (ref 3.5–5.1)
Sodium: 139 mEq/L (ref 135–145)
Total Bilirubin: 0.3 mg/dL (ref 0.2–1.2)
Total Protein: 7.2 g/dL (ref 6.0–8.3)

## 2018-07-30 LAB — TSH: TSH: 3.52 u[IU]/mL (ref 0.35–4.50)

## 2018-07-30 LAB — HEMOGLOBIN A1C: Hgb A1c MFr Bld: 5.4 % (ref 4.6–6.5)

## 2018-07-30 NOTE — Assessment & Plan Note (Signed)
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 Encouraged, stress reduction, decreased eye strain, improve sleep, increase water.  No red flags for intracranial bleed, mass etc... but if not improving will need in office exam next week for neuro exam.  Er precautions for neuro changes given.

## 2018-07-30 NOTE — Addendum Note (Signed)
Addended by: Eliezer Lofts E on: 07/30/2018 11:23 AM   Modules accepted: Orders

## 2018-07-30 NOTE — Progress Notes (Signed)
VIRTUAL VISIT Due to national recommendations of social distancing due to [place] 19, a virtual visit is felt to be most appropriate for this patient at this time.   I connected with the patient on 07/30/18 at  8:20 AM EDT by virtual telehealth platform and verified that I am speaking with the correct person using two identifiers.   I discussed the limitations, risks, security and privacy concerns of performing an evaluation and management service by  virtual telehealth platform and the availability of in person appointments. I also discussed with the patient that there may be a patient responsible charge related to this service. The patient expressed understanding and agreed to proceed.  Patient location: Home Provider Location: [facility] Participants: Connie [name] and Connie [name]   Chief Complaint  Patient presents with  . Blurred Vision  . Migraine    More frequent    History of Present Illness: 58 year old female with history of migraines without aura presents with worsening migraines. She has noted migraines occurring once weekly in last month ( usually once a month). She is sensitive to weather.  feels tightness in head, throbbing pounding pain, right or left sides,  occ associated with nausea.  One migraine last week associated with dizziness. She uses ice and ibuprofen to treat.  She reports  intermittant double vision and blurred vision. Ongoing x 2 weeks.  She has to blink  a lot when using computer to clear vision. On computer 10 hours a  Day.  She wears glasses.. feels like has to squint.  Last eye exam last year.. reading vision was increased at that time.   No additional neuro change.. no numbness, no weakness, no slurred speech, no memory changes. no fever  She has been having a lot of allergies  Earlier in week at [facility] for neck and back issues.  BP was 123/82.   SE ito imitrex in past.   She does feel like she is under more stress lately.  Not sleeping well lately. 4 hours sleep at night. Issues staying asleep. Tried lunesta last year.. only helped a little but SE .   no falls, no head injuries.  She feels ill after eating sweets. COVID 19 screen No recent travel or known exposure to COVID19 The patient denies respiratory symptoms of COVID 19 at this time.  The importance of social distancing was discussed today.   Review of Systems  Constitutional: Negative for chills and fever.  HENT: Negative for congestion and ear pain.   Eyes: Negative for pain and redness.  Respiratory: Negative for cough and shortness of breath.   Cardiovascular: Negative for chest pain, palpitations and leg swelling.  Gastrointestinal: Negative for abdominal pain, blood in stool, constipation, diarrhea, nausea and vomiting.  Genitourinary: Negative for dysuria.  Musculoskeletal: Negative for falls and myalgias.  Skin: Negative for rash.  Neurological: Positive for headaches. Negative for tingling, tremors, sensory change, speech change, focal weakness, seizures, loss of consciousness and weakness.  Psychiatric/Behavioral: Negative for depression. The patient has insomnia. The patient is not nervous/anxious.       Past Medical History:  Diagnosis Date  . Acid reflux 5 years  . H/O umbilical hernia repair 1761  . Hx of hysterectomy 2013  . Migraines   . Post partum depression 1986, 1998   W/both children - Tx with antidepressants for second child  . Reflux     reports that she has never smoked. She has never used smokeless tobacco. She reports that  she does not drink alcohol or use drugs.   Current Outpatient Medications:  Marland Kitchen  Multiple Vitamins-Minerals (MULTIVITAMIN WITH MINERALS) tablet, Take 1 tablet by mouth daily., Disp: , Rfl:    Observations/Objective: Height 5\' 5"  (1.651 m).  Physical Exam  Physical Exam Constitutional:      General: The patient is not in acute distress. Pulmonary:     Effort: Pulmonary effort is normal. No  respiratory distress.  Neurological:     Mental Status: The patient is alert and oriented to person, place, and time.   Psychiatric:        Mood and Affect: Mood normal.        Behavior: Behavior normal.    Assessment and Plan Migraine without aura Increase in frequency likely due to increase in stress, weather trigger, poor sleep and  10 hours of computer work a day. Encouraged, stress reduction, decreased eye strain, improve sleep, increase water.  No red flags for intracranial bleed, mass etc... but if not improving will need in office exam next week for neuro exam.  Er precautions for neuro changes given.  Blurred vision, bilateral Possibly due to eye strain. Pt to make eye exam.  Will eval with labs for new glucose issue, electrolyte issue etc.  INSOMNIA Poor control.. worsened lately.  Will increase exercise, work on sleep hygeine, start melatonin 3 mg nightly.  If not improving consider trazodone for sleep.     I discussed the assessment and treatment plan with the patient. The patient was provided an opportunity to ask questions and all were answered. The patient agreed with the plan and demonstrated an understanding of the instructions.   The patient was advised to call back or seek an in-person evaluation if the symptoms worsen or if the condition fails to improve as anticipated.     Connie Lofts, MD

## 2018-07-30 NOTE — Assessment & Plan Note (Signed)
Possibly due to eye strain. Pt to make eye exam.  Will eval with labs for new glucose issue, electrolyte issue etc.

## 2018-07-30 NOTE — Progress Notes (Signed)
Labs 5/15 In offoce 5/19

## 2018-07-30 NOTE — Assessment & Plan Note (Signed)
Poor control.. worsened lately.  Will increase exercise, work on sleep hygeine, start melatonin 3 mg nightly.  If not improving consider trazodone for sleep.

## 2018-07-30 NOTE — Patient Instructions (Signed)
We will call to set up curbside labs and appt for next Tuesday in office.  Increase water, decrease eye strain and stress.  Set up eye exam with opthalmology. Start melatonin for sleep, 3 mg.  go to ER if severe headaceh or new neurologic symptoms.

## 2018-08-03 ENCOUNTER — Ambulatory Visit (INDEPENDENT_AMBULATORY_CARE_PROVIDER_SITE_OTHER): Payer: 59 | Admitting: Family Medicine

## 2018-08-03 ENCOUNTER — Other Ambulatory Visit: Payer: Self-pay

## 2018-08-03 ENCOUNTER — Encounter: Payer: Self-pay | Admitting: Family Medicine

## 2018-08-03 VITALS — BP 110/80 | HR 74 | Temp 97.9°F | Ht 65.0 in | Wt 179.8 lb

## 2018-08-03 DIAGNOSIS — G43009 Migraine without aura, not intractable, without status migrainosus: Secondary | ICD-10-CM

## 2018-08-03 DIAGNOSIS — G4700 Insomnia, unspecified: Secondary | ICD-10-CM

## 2018-08-03 DIAGNOSIS — H538 Other visual disturbances: Secondary | ICD-10-CM

## 2018-08-03 MED ORDER — TRAZODONE HCL 50 MG PO TABS: 25.0000 mg | ORAL_TABLET | Freq: Every evening | ORAL | 3 refills | Status: DC | PRN

## 2018-08-03 NOTE — Progress Notes (Signed)
Subjective:    Patient ID: Connie [name], female    DOB: 06/12/1960, 58 y.o.   MRN: [id number]  HPI  58 year old female pt with history of migraine presents with worsening headaches. Summary of HPI copied from 5/15 virtual visit... pt was brought in for neuro exam. She has noted migraines occurring once weekly in last month ( usually once a month). She is sensitive to weather.  Feels tightness in head, throbbing pounding pain, right or left sides,  occ associated with nausea.  One migraine last week associated with dizziness. She uses ice and ibuprofen to treat.  She reports  intermittant double vision and blurred vision. Ongoing x 2 weeks.  She has to blink  a lot when using computer to clear vision. On computer 10 hours a  Day.  She wears glasses.. feels like has to squint.  Last eye exam last year.. reading vision was increased at that time.   No additional neuro change.. no numbness, no weakness, no slurred speech, no memory changes. no fever Increase in stress lately, insomnia.    She has been having a lot of allergies  Earlier  at [employer] for neck and back issues.  NML glucose, cbc, CMET TSH.  Today she reports she continued to have migraine headache.. severe over the weekend. Some blurred vision 3 days ago when had migraine.   Melatonin helped some on day 1 but only last 2-3 hours afterward.   Social History /Family History/Past Medical History reviewed in detail and updated in EMR if needed. Blood pressure 110/80, pulse 74, temperature 97.9 F (36.6 C), temperature source Oral, height 5\' 5"  (1.651 m), weight 179 lb 12 oz (81.5 kg).  Review of Systems  Constitutional: Negative for fatigue and fever.  HENT: Negative for congestion.   Eyes: Negative for pain.  Respiratory: Negative for cough and shortness of breath.   Cardiovascular: Negative for chest pain, palpitations and leg swelling.  Gastrointestinal: Negative for abdominal pain.  Genitourinary:  Negative for dysuria and vaginal bleeding.  Musculoskeletal: Negative for back pain.  Neurological: Positive for headaches. Negative for syncope, weakness, light-headedness and numbness.  Psychiatric/Behavioral: Negative for dysphoric mood.       Objective:   Physical Exam Constitutional:      General: She is not in acute distress.    Appearance: Normal appearance. She is well-developed. She is not ill-appearing or toxic-appearing.  HENT:     Head: Normocephalic.     Right Ear: Hearing, tympanic membrane, ear canal and external ear normal. Tympanic membrane is not erythematous, retracted or bulging.     Left Ear: Hearing, tympanic membrane, ear canal and external ear normal. Tympanic membrane is not erythematous, retracted or bulging.     Nose: No mucosal edema or rhinorrhea.     Right Sinus: No maxillary sinus tenderness or frontal sinus tenderness.     Left Sinus: No maxillary sinus tenderness or frontal sinus tenderness.     Mouth/Throat:     Pharynx: Uvula midline.  Eyes:     General: Lids are normal. Lids are everted, no foreign bodies appreciated.     Conjunctiva/sclera: Conjunctivae normal.     Pupils: Pupils are equal, round, and reactive to light.  Neck:     Musculoskeletal: Normal range of motion and neck supple.     Thyroid: No thyroid mass or thyromegaly.     Vascular: No carotid bruit.     Trachea: Trachea normal.  Cardiovascular:     Rate and Rhythm:  Normal rate and regular rhythm.     Pulses: Normal pulses.     Heart sounds: Normal heart sounds, S1 normal and S2 normal. No murmur. No friction rub. No gallop.   Pulmonary:     Effort: Pulmonary effort is normal. No tachypnea or respiratory distress.     Breath sounds: Normal breath sounds. No decreased breath sounds, wheezing, rhonchi or rales.  Abdominal:     General: Bowel sounds are normal.     Palpations: Abdomen is soft.     Tenderness: There is no abdominal tenderness.  Skin:    General: Skin is warm and  dry.     Findings: No rash.  Neurological:     Mental Status: She is alert and oriented to person, place, and time.     GCS: GCS eye subscore is 4. GCS verbal subscore is 5. GCS motor subscore is 6.     Cranial Nerves: No cranial nerve deficit.     Sensory: No sensory deficit.     Motor: No abnormal muscle tone.     Coordination: Coordination normal.     Gait: Gait normal.     Deep Tendon Reflexes: Reflexes are normal and symmetric.     Comments: Nml cerebellar exam   No papilledema  Psychiatric:        Mood and Affect: Mood is not anxious or depressed.        Speech: Speech normal.        Behavior: Behavior normal. Behavior is cooperative.        Thought Content: Thought content normal.        Cognition and Memory: Memory is not impaired. She does not exhibit impaired recent memory or impaired remote memory.        Judgment: Judgment normal.           Assessment & Plan:

## 2018-08-03 NOTE — Assessment & Plan Note (Signed)
May be contributing to increase in migraine... start tramadol prn as melatonin not helpful.

## 2018-08-03 NOTE — Assessment & Plan Note (Signed)
Nml neuro exam. No red flags. Poor control of migraines...refer to neurology for recommendation given pt interested in newer non triptan acute treatments.

## 2018-08-03 NOTE — Patient Instructions (Signed)
Keep migraine diary.  Stop at front desk for referral to neurologist.  Keep appt at opthalmology as planned.  Start trazodone for sleep at night.

## 2018-08-03 NOTE — Assessment & Plan Note (Signed)
Intermittent and possibly related to migraine... refer to neuro of consideration of association.  Recommend opthalmology eval.  Nml eye exam in office today

## 2018-08-10 ENCOUNTER — Telehealth (INDEPENDENT_AMBULATORY_CARE_PROVIDER_SITE_OTHER): Payer: 59 | Admitting: Urology

## 2018-08-10 ENCOUNTER — Telehealth: Payer: Self-pay | Admitting: Urology

## 2018-08-10 ENCOUNTER — Other Ambulatory Visit: Payer: Self-pay

## 2018-08-10 DIAGNOSIS — R300 Dysuria: Secondary | ICD-10-CM

## 2018-08-10 DIAGNOSIS — R109 Unspecified abdominal pain: Secondary | ICD-10-CM

## 2018-08-10 NOTE — Telephone Encounter (Signed)
Would she be able to do a virtual visit today?

## 2018-08-10 NOTE — Telephone Encounter (Signed)
Pt. States she went to urgent care on Sunday 08/08/18  And has been on antibiotic for 2 days. She is experiencing lower back pain and she is very nauseated. Would like to talk to a CMA today.

## 2018-08-10 NOTE — Progress Notes (Signed)
Virtual Visit via Audio Visual Note I connected with Connie [name]  on 08/10/2018 at 1311 by audio/visual ([email or web address]) and verified that I am speaking with the correct person using two identifiers.  They are located at home.  I am located at my home.    This visit type was conducted due to national recommendations for restrictions regarding the COVID-19 Pandemic (e.g. social distancing).  This format is felt to be most appropriate for this patient at this time.  All issues noted in this document were discussed and addressed.  No physical exam was performed.   I discussed the limitations, risks, security and privacy concerns of performing an evaluation and management service by telephone and the availability of in person appointments. I also discussed with the patient that there may be a patient responsible charge related to this service. The patient expressed understanding and agreed to proceed.   History of Present Illness: Connie [name] is a 58 year old Caucasian female with a history of UTI, remote history of nephrolithiasis and mixed incontinence who is contacted today after no improvement of her symptoms after being seen at [facility] urgent care.    She says her symptoms began on Saturday night (05/23) with dysuria and urgency.  UA was not performed as she was taking AZO, but urine culture was sent.  She was started on doxycycline 100 mg, BID x 5 days. Her urine culture grew out <10,000 colonies.    Last night, she started with lower and upper back pain mostly on the right.  She has been pushing her fluids and her urine has been clear.  She has not seen gross hematuria.  She had mild nausea this morning and mild chills, but she is not having any fevers or vomiting.  She is still having mild dysuria.     Observations/Objective: She is dressed appropriately and well groomed.  She does not appear distressed.    Assessment and Plan:  1. Dysuria Mrs. Misner and I discussed pursuing a CT Renal  stone study at this time as her symptoms have not really improved with the antibiotic, her urine culture was not indicative of an UTI and she is having right sided pain with a remote history of nephrolithiasis.  She was like to table the CT for now and have her urine rechecked for a microscopic evaluation and repeat culture.  2. Right back/flank pain See above Patient is advised that if they should start to experience pain that is not able to be controlled with pain medication, intractable nausea and/or vomiting and/or fevers greater than 103 or shaking chills to contact the office immediately or seek treatment in the emergency department for emergent intervention.     Follow Up Instructions:  Connie [name] will present to the office tomorrow to provide [place] with an UA and culture.  I will call her with the UA results tomorrow and discuss her next steps further ending UA results.     I discussed the assessment and treatment plan with the patient. The patient was provided an opportunity to ask questions and all were answered. The patient agreed with the plan and demonstrated an understanding of the instructions.   The patient was advised to call back or seek an in-person evaluation if the symptoms worsen or if the condition fails to improve as anticipated.  I provided 10 minutes of face-to-face time during this encounter.   Catlin Aycock, PA-C

## 2018-08-11 ENCOUNTER — Telehealth: Payer: Self-pay | Admitting: Urology

## 2018-08-11 ENCOUNTER — Other Ambulatory Visit: Payer: 59

## 2018-08-11 ENCOUNTER — Other Ambulatory Visit: Payer: Self-pay

## 2018-08-11 DIAGNOSIS — R300 Dysuria: Secondary | ICD-10-CM

## 2018-08-11 LAB — URINALYSIS, COMPLETE
Bilirubin, UA: NEGATIVE
Glucose, UA: NEGATIVE
Ketones, UA: NEGATIVE
Leukocytes,UA: NEGATIVE
Nitrite, UA: NEGATIVE
Protein,UA: NEGATIVE
Specific Gravity, UA: 1.01 (ref 1.005–1.030)
Urobilinogen, Ur: 0.2 mg/dL (ref 0.2–1.0)
pH, UA: 7 (ref 5.0–7.5)

## 2018-08-11 LAB — MICROSCOPIC EXAMINATION: RBC, Urine: NONE SEEN /hpf (ref 0–2)

## 2018-08-11 NOTE — Telephone Encounter (Signed)
Spoke with Connie [name] and she did not have a painful night.  She is still having twinges of pain that are either on the left or right when sitting.  She is still having dysuria.  She denies gross hematuria, suprapubic/flank pain, nausea, vomiting, fevers or chills.  I informed her that her urine was negative and that the urine culture will take at least 72 hours for a final result.  She does not want to pursue a CT scan at this time.  Patient is advised that if they should start to experience pain that is not able to be controlled with pain medication, intractable nausea and/or vomiting and/or fevers greater than 103 or shaking chills to contact the office immediately or seek treatment in the emergency department for emergent intervention.

## 2018-08-13 LAB — CULTURE, URINE COMPREHENSIVE

## 2018-08-16 ENCOUNTER — Telehealth: Payer: Self-pay | Admitting: Family Medicine

## 2018-08-16 NOTE — Telephone Encounter (Signed)
Left message asking pt to call office please r/s 6/26 appointment with dr Diona Browner

## 2018-08-17 ENCOUNTER — Other Ambulatory Visit: Payer: Self-pay

## 2018-08-23 ENCOUNTER — Telehealth: Payer: Self-pay | Admitting: Family Medicine

## 2018-08-23 ENCOUNTER — Other Ambulatory Visit: Payer: Self-pay | Admitting: Neurology

## 2018-08-23 DIAGNOSIS — E559 Vitamin D deficiency, unspecified: Secondary | ICD-10-CM

## 2018-08-23 DIAGNOSIS — E538 Deficiency of other specified B group vitamins: Secondary | ICD-10-CM

## 2018-08-23 DIAGNOSIS — G43009 Migraine without aura, not intractable, without status migrainosus: Secondary | ICD-10-CM

## 2018-08-23 NOTE — Telephone Encounter (Signed)
Patient would like to know if you could put in orders for her to have her vitamin B & D checked at her lab appointment in July. Her Neurologist wanted her to have these checked.

## 2018-08-24 NOTE — Telephone Encounter (Signed)
Done

## 2018-09-02 ENCOUNTER — Ambulatory Visit
Admission: RE | Admit: 2018-09-02 | Discharge: 2018-09-02 | Disposition: A | Discharge status: Home / Self Care | Payer: 59 | Source: Ambulatory Visit | Attending: Neurology | Admitting: Neurology

## 2018-09-02 ENCOUNTER — Other Ambulatory Visit: Payer: Self-pay

## 2018-09-02 DIAGNOSIS — G43009 Migraine without aura, not intractable, without status migrainosus: Secondary | ICD-10-CM | POA: Diagnosis present

## 2018-09-02 MED ORDER — GADOBUTROL 1 MMOL/ML IV SOLN
8.0000 mL | Freq: Once | INTRAVENOUS | Status: AC | PRN
  Administered 2018-09-02: 8 mL via INTRAVENOUS

## 2018-09-03 ENCOUNTER — Other Ambulatory Visit: Payer: 59

## 2018-09-10 ENCOUNTER — Encounter: Payer: 59 | Admitting: Family Medicine

## 2018-09-20 ENCOUNTER — Telehealth: Payer: Self-pay | Admitting: Family Medicine

## 2018-09-20 DIAGNOSIS — D509 Iron deficiency anemia, unspecified: Secondary | ICD-10-CM

## 2018-09-20 DIAGNOSIS — Z13220 Encounter for screening for lipoid disorders: Secondary | ICD-10-CM

## 2018-09-20 NOTE — Addendum Note (Signed)
Addended by: Ellamae Sia on: 09/20/2018 03:29 PM   Modules accepted: Orders

## 2018-09-20 NOTE — Telephone Encounter (Signed)
-----   Message from Ellamae Sia sent at 09/20/2018  9:38 AM EDT ----- Regarding: Lab orders for Tuesday, 7.7.20 Patient is scheduled for CPX labs, please order future labs, Thanks , Karna Christmas

## 2018-09-21 ENCOUNTER — Other Ambulatory Visit: Payer: Self-pay

## 2018-09-21 ENCOUNTER — Other Ambulatory Visit (INDEPENDENT_AMBULATORY_CARE_PROVIDER_SITE_OTHER): Payer: 59

## 2018-09-21 DIAGNOSIS — E538 Deficiency of other specified B group vitamins: Secondary | ICD-10-CM

## 2018-09-21 DIAGNOSIS — E559 Vitamin D deficiency, unspecified: Secondary | ICD-10-CM | POA: Diagnosis not present

## 2018-09-21 DIAGNOSIS — Z13220 Encounter for screening for lipoid disorders: Secondary | ICD-10-CM

## 2018-09-21 DIAGNOSIS — D509 Iron deficiency anemia, unspecified: Secondary | ICD-10-CM

## 2018-09-21 LAB — CBC WITH DIFFERENTIAL/PLATELET
Basophils Absolute: 0.1 10*3/uL (ref 0.0–0.1)
Basophils Relative: 1.2 % (ref 0.0–3.0)
Eosinophils Absolute: 0.3 10*3/uL (ref 0.0–0.7)
Eosinophils Relative: 6.2 % — ABNORMAL HIGH (ref 0.0–5.0)
HCT: 43.1 % (ref 36.0–46.0)
Hemoglobin: 14.3 g/dL (ref 12.0–15.0)
Lymphocytes Relative: 21.6 % (ref 12.0–46.0)
Lymphs Abs: 1.2 10*3/uL (ref 0.7–4.0)
MCHC: 33.2 g/dL (ref 30.0–36.0)
MCV: 93.4 fl (ref 78.0–100.0)
Monocytes Absolute: 0.3 10*3/uL (ref 0.1–1.0)
Monocytes Relative: 6.2 % (ref 3.0–12.0)
Neutro Abs: 3.6 10*3/uL (ref 1.4–7.7)
Neutrophils Relative %: 64.8 % (ref 43.0–77.0)
Platelets: 280 10*3/uL (ref 150.0–400.0)
RBC: 4.61 Mil/uL (ref 3.87–5.11)
RDW: 13.5 % (ref 11.5–15.5)
WBC: 5.6 10*3/uL (ref 4.0–10.5)

## 2018-09-21 LAB — COMPREHENSIVE METABOLIC PANEL
ALT: 16 U/L (ref 0–35)
AST: 12 U/L (ref 0–37)
Albumin: 3.9 g/dL (ref 3.5–5.2)
Alkaline Phosphatase: 64 U/L (ref 39–117)
BUN: 11 mg/dL (ref 6–23)
CO2: 30 mEq/L (ref 19–32)
Calcium: 8.5 mg/dL (ref 8.4–10.5)
Chloride: 104 mEq/L (ref 96–112)
Creatinine, Ser: 0.77 mg/dL (ref 0.40–1.20)
GFR: 77.46 mL/min (ref >60.00)
Glucose, Bld: 87 mg/dL (ref 70–99)
Potassium: 3.8 mEq/L (ref 3.5–5.1)
Sodium: 140 mEq/L (ref 135–145)
Total Bilirubin: 0.4 mg/dL (ref 0.2–1.2)
Total Protein: 6.1 g/dL (ref 6.0–8.3)

## 2018-09-21 LAB — LIPID PANEL
Cholesterol: 166 mg/dL (ref 0–200)
HDL: 51.8 mg/dL (ref >39.00)
LDL Cholesterol: 96 mg/dL (ref 0–99)
NonHDL: 114.65
Total CHOL/HDL Ratio: 3
Triglycerides: 93 mg/dL (ref 0.0–149.0)
VLDL: 18.6 mg/dL (ref 0.0–40.0)

## 2018-09-21 LAB — VITAMIN B12: Vitamin B-12: 299 pg/mL (ref 211–911)

## 2018-09-21 LAB — VITAMIN D 25 HYDROXY (VIT D DEFICIENCY, FRACTURES): VITD: 40.43 ng/mL (ref 30.00–100.00)

## 2018-09-23 ENCOUNTER — Encounter: Payer: Self-pay | Admitting: Family Medicine

## 2018-09-23 ENCOUNTER — Ambulatory Visit (INDEPENDENT_AMBULATORY_CARE_PROVIDER_SITE_OTHER): Payer: 59 | Admitting: Family Medicine

## 2018-09-23 VITALS — BP 120/75 | Ht 65.5 in | Wt 181.3 lb

## 2018-09-23 DIAGNOSIS — Z1159 Encounter for screening for other viral diseases: Secondary | ICD-10-CM

## 2018-09-23 DIAGNOSIS — Z13220 Encounter for screening for lipoid disorders: Secondary | ICD-10-CM

## 2018-09-23 DIAGNOSIS — G4700 Insomnia, unspecified: Secondary | ICD-10-CM

## 2018-09-23 DIAGNOSIS — G43009 Migraine without aura, not intractable, without status migrainosus: Secondary | ICD-10-CM | POA: Diagnosis not present

## 2018-09-23 DIAGNOSIS — Z0000 Encounter for general adult medical examination without abnormal findings: Secondary | ICD-10-CM

## 2018-09-23 MED ORDER — NORTRIPTYLINE HCL 10 MG PO CAPS: 10.0000 mg | ORAL_CAPSULE | Freq: Every day | ORAL | 3 refills | Status: DC

## 2018-09-23 NOTE — Patient Instructions (Signed)
Start nortriptiline at bedtime for sleep.  Call/Mychart for an update in 2 weeks.  Can use Miralax daily for constipation.

## 2018-09-23 NOTE — Assessment & Plan Note (Signed)
Follwed now by Neuro Dr. Manuella Ghazi... on propranolol and maxalt.

## 2018-09-23 NOTE — Progress Notes (Signed)
VIRTUAL VISIT Due to national recommendations of social distancing due to [place] 19, a virtual visit is felt to be most appropriate for this patient at this time.   I connected with the patient on 09/23/18 at  9:40 AM EDT by virtual telehealth platform and verified that I am speaking with the correct person using two identifiers.   I discussed the limitations, risks, security and privacy concerns of performing an evaluation and management service by  virtual telehealth platform and the availability of in person appointments. I also discussed with the patient that there may be a patient responsible charge related to this service. The patient expressed understanding and agreed to proceed.  Patient location: Home Provider Location: [facility] Participants: Eliezer Lofts and Glenna Fellows   Chief Complaint  Patient presents with  . Annual Exam    History of Present Illness: The patient is here for annual wellness exam and preventative care.     Chronic insomnia: trial of trazodone.. 1/2 tablet does not help and 1 full tablet gave her restless legs.  Still trouble staying asleep. Melatonin worsened migraines  SE to lunesta.. caused constipation  SE to Ambien.. dreams and panic.   Migraine:Now followed by Neurology, Dr. Manuella Ghazi.. started propranolol prevention, using maxalt prn acute migraine.  she is having 1-2 a week, occ lasting several days.  Blurred vision... had opthalmology appt... pressure are higher.. started on drops... this also could increase migraines  exercsie: none.. plans to get back to it   Diet: good. Depression screen PHQ 2/9 09/23/2018  Decreased Interest 0  Down, Depressed, Hopeless 0  PHQ - 2 Score 0     Cholesterol and labs reviewed in detail.  COVID 19 screen No recent travel or known exposure to COVID19 The patient denies respiratory symptoms of COVID 19 at this time.  The importance of social distancing was discussed today.   Review of Systems   Constitutional: Negative for chills and fever.  HENT: Negative for congestion and ear pain.   Eyes: Negative for pain and redness.  Respiratory: Negative for cough and shortness of breath.   Cardiovascular: Negative for chest pain, palpitations and leg swelling.  Gastrointestinal: Negative for abdominal pain, blood in stool, constipation, diarrhea, nausea and vomiting.  Genitourinary: Negative for dysuria.  Musculoskeletal: Negative for falls and myalgias.  Skin: Negative for rash.  Neurological: Negative for dizziness.  Psychiatric/Behavioral: Negative for depression. The patient is not nervous/anxious.       Past Medical History:  Diagnosis Date  . Acid reflux 5 years  . H/O umbilical hernia repair 1610  . Hx of hysterectomy 2013  . Migraines   . Post partum depression 1986, 1998   W/both children - Tx with antidepressants for second child  . Reflux     reports that she has never smoked. She has never used smokeless tobacco. She reports that she does not drink alcohol or use drugs.   Current Outpatient Medications:  .  Coenzyme Q10 10 MG capsule, Take 10 mg by mouth daily. , Disp: , Rfl:  .  latanoprost (XALATAN) 0.005 % ophthalmic solution, PLACE 1 DROP IN [facility] EYE DAILY AT BEDTIME, Disp: , Rfl:  .  MELATONIN GUMMIES PO, Take 4 mg by mouth at bedtime., Disp: , Rfl:  .  Multiple Vitamins-Minerals (MULTIVITAMIN WITH MINERALS) tablet, Take 1 tablet by mouth daily., Disp: , Rfl:  .  Omega-3 Fatty Acids (RA FISH OIL) 1000 MG CAPS, Take 1 capsule by mouth daily. , Disp: ,  Rfl:  .  propranolol ER (INDERAL LA) 60 MG 24 hr capsule, Take by mouth., Disp: , Rfl:  .  rizatriptan (MAXALT-MLT) 10 MG disintegrating tablet, TAKE 1 TAB BY MOUTH ONCE AS NEEDED FOR MIGRAINE**MAY TAKE 2ND DOSE AFTER 2 HRS IF NEEDED, Disp: , Rfl:  .  traZODone (DESYREL) 50 MG tablet, Take 0.5-1 tablets (25-50 mg total) by mouth at bedtime as needed for sleep. (Patient not taking: Reported on 09/23/2018), Disp: 30  tablet, Rfl: 3   Observations/Objective: Blood pressure 120/75, height 5' 5.5" (1.664 m), weight 181 lb 5 oz (82.2 kg).  Physical Exam  Physical Exam Constitutional:      General: The patient is not in acute distress. Pulmonary:     Effort: Pulmonary effort is normal. No respiratory distress.  Neurological:     Mental Status: The patient is alert and oriented to person, place, and time.  Psychiatric:        Mood and Affect: Mood normal.        Behavior: Behavior normal.   Assessment and Plan The patient's preventative maintenance and recommended screening tests for an annual wellness exam were reviewed in full today. Brought up to date unless services declined.  Counselled on the importance of diet, exercise, and its role in overall health and mortality. The patient's FH and SH was reviewed, including their home life, tobacco status, and drug and alcohol status.   Vaccines:due for Tdap Pap/DVE:  Sees Dr. Budd Palmer, partial hysterectomy Mammo: due Colon: 2016 Dr. Earlean Shawl Smoking Status:non smoker ETOH/ drug use:  Hep C: due  HIV screen:     Migraine without aura Follwed now by Neuro Dr. Manuella Ghazi... on propranolol and maxalt.   Chronic insomnia Has failed... trazodone, ambien, lunesta, melatonin.  Will try trial of nortriptiline low dose ( can use miralax given tends toward constipation) as this may also help with migraine prevention.  Likely will need to increase to 25 mg dose.   I discussed the assessment and treatment plan with the patient. The patient was provided an opportunity to ask questions and all were answered. The patient agreed with the plan and demonstrated an understanding of the instructions.   The patient was advised to call back or seek an in-person evaluation if the symptoms worsen or if the condition fails to improve as anticipated.     Eliezer Lofts, MD

## 2018-09-23 NOTE — Assessment & Plan Note (Signed)
Has failed... trazodone, ambien, lunesta, melatonin.  Will try trial of nortriptiline low dose ( can use miralax given tends toward constipation) as this may also help with migraine prevention.  Likely will need to increase to 25 mg dose.

## 2018-11-17 ENCOUNTER — Ambulatory Visit: Payer: Self-pay

## 2018-11-17 ENCOUNTER — Ambulatory Visit (INDEPENDENT_AMBULATORY_CARE_PROVIDER_SITE_OTHER): Payer: 59 | Admitting: Orthopedic Surgery

## 2018-11-17 ENCOUNTER — Encounter: Payer: Self-pay | Admitting: Orthopedic Surgery

## 2018-11-17 DIAGNOSIS — M25562 Pain in left knee: Secondary | ICD-10-CM | POA: Diagnosis not present

## 2018-11-17 DIAGNOSIS — M542 Cervicalgia: Secondary | ICD-10-CM

## 2018-11-17 NOTE — Progress Notes (Signed)
Office Visit Note   Patient: Connie [name]           Date of Birth: May 14, 1960           MRN: [id number] Visit Date: 11/17/2018 Requested by: Jinny Sanders, MD [place],  [place] [id number] PCP: Jinny Sanders, MD  Subjective: Chief Complaint  Patient presents with   Neck - Pain   Left Knee - Pain    HPI: Connie [name] is a patient with left knee pain and back pain.  She fell in the kitchen a few weeks ago which was a direct impact injury to the lateral aspect of her left knee.  She reports pain swelling and symptoms which worsen with prolonged standing and walking.  Denies much in the way of popping locking or giving way.  Takes ibuprofen as needed.  She actually broke her teeth during this fall.  She reports some swelling around the patella.  She does like to walk.  Patient also reports chronic pain in the neck.  Describes radiating pain into both arms and hands.  She also describes decreased range of motion of her neck.  She works a lot at [employer] job.  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  The symptoms have been going on now for about a year with no history of trauma.              ROS: All systems reviewed are negative as they relate to the chief complaint within the history of present illness.  Patient denies  fevers or chills.   Assessment & Plan: Visit Diagnoses:  1. Left knee pain, unspecified chronicity   2. Neck pain   3. Cervicalgia     Plan: Impression is impact injury left knee with negative exam and negative radiographs.  This should be a self-limited problem.  Could consider intra-articular injection if her symptoms do not continue to improve over the next 4 weeks.  Regarding her neck pain I think she is having some radicular signs and symptoms in her arms.  Radiographs do show abnormality at C5-6.  Symptoms have been ongoing now for many months.  She is failed conservative treatment for this particular problem.  Plan MRI C-spine to  evaluate bilateral radiculopathy.  No myelopathic signs or symptoms today.  Follow-up with me after that study  Follow-Up Instructions: No follow-ups on file.   Orders:  Orders Placed This Encounter  Procedures   XR KNEE 3 VIEW LEFT   XR Cervical Spine 2 or 3 views   MR Cervical Spine w/o contrast   No orders of the defined types were placed in this encounter.     Procedures: No procedures performed   Clinical Data: No additional findings.  Objective: Vital Signs: There were no vitals taken for this visit.  Physical Exam:   Constitutional: Patient appears well-developed HEENT:  Head: Normocephalic Eyes:EOM are normal Neck: Normal range of motion Cardiovascular: Normal rate Pulmonary/chest: Effort normal Neurologic: Patient is alert Skin: Skin is warm Psychiatric: Patient has normal mood and affect    Ortho Exam: Ortho exam demonstrates mild tenderness proximal lateral tibial plateau on the left along with some periretinacular tenderness.  Extensor mechanism is intact and there is no effusion in the knee.  Collateral crucial ligaments are stable.  No effusion present.  No nerve root tension signs and palpable pedal pulses noted. Patient has pretty reasonable cervical spine range of motion with 5 out of 5 grip EPL  FPL interosseous wrist flexion extension bicep triceps and deltoid strength.  No paresthesias C5-T1.  No other masses lymphadenopathy or skin changes noted in that neck region.  Negative Hoffmann's reflex.  Reflexes otherwise symmetric bilateral biceps triceps.  Specialty Comments:  No specialty comments available.  Imaging: Xr Cervical Spine 2 Or 3 Views  Result Date: 11/17/2018 AP lateral cervical spine reviewed.  Degenerative disc disease is present at C5-6 but lordosis is maintained.  Fairly minimal facet arthritis is present.  No other acute fracture or dislocation.  Xr Knee 3 View Left  Result Date: 11/17/2018 AP lateral merchant left knee reviewed.   No acute fracture dislocation is present.  Minimal arthritis and degenerative disease is present.  Alignment normal.    PMFS History: Patient Active Problem List   Diagnosis Date Noted   Generalized body aches 06/10/2018   Urinary frequency 06/10/2018   History of pneumonia 05/14/2018   Recurrent UTI 11/25/2017   FIBROIDS, UTERUS 01/25/2010   ANEMIA, IRON DEFICIENCY 11/23/2009   MENORRHAGIA 11/23/2009   LIBIDO, DECREASED 11/23/2009   LEG CRAMPS, NOCTURNAL 07/19/2009   Chronic insomnia 07/19/2009   Major depression in full remission ([place]) 05/28/2007   Migraine without aura 05/28/2007   GERD 05/28/2007   Past Medical History:  Diagnosis Date   Acid reflux 5 years   H/O umbilical hernia repair [id number]   Hx of hysterectomy 2013   Migraines    Post partum depression 1986, 1998   W/both children - Tx with antidepressants for second child   Reflux     Family History  Problem Relation Age of Onset   Hyperlipidemia Mother    Fibromyalgia Mother    Osteoporosis Mother    Hypertension Mother    Hyperlipidemia Father    Heart disease Father 71       MI, Tipple bypass surgery   Asthma Son    Allergies Son    Breast cancer Maternal Grandmother     Past Surgical History:  Procedure Laterality Date   UMBILICAL HERNIA REPAIR  123456   No complications   Vaginal births  66, 66   Social History   Occupational History   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

## 2018-12-11 ENCOUNTER — Other Ambulatory Visit: Payer: 59

## 2019-09-26 ENCOUNTER — Encounter: Payer: Self-pay | Admitting: Urology

## 2019-09-26 ENCOUNTER — Other Ambulatory Visit
Admission: RE | Admit: 2019-09-26 | Discharge: 2019-09-26 | Disposition: A | Discharge status: Home / Self Care | Payer: 59 | Attending: Urology | Admitting: Urology

## 2019-09-26 ENCOUNTER — Ambulatory Visit (INDEPENDENT_AMBULATORY_CARE_PROVIDER_SITE_OTHER): Payer: 59 | Admitting: Urology

## 2019-09-26 ENCOUNTER — Other Ambulatory Visit: Payer: Self-pay | Admitting: *Deleted

## 2019-09-26 ENCOUNTER — Other Ambulatory Visit: Payer: Self-pay

## 2019-09-26 VITALS — BP 118/80 | HR 93 | Ht 65.5 in | Wt 178.0 lb

## 2019-09-26 DIAGNOSIS — N3001 Acute cystitis with hematuria: Secondary | ICD-10-CM | POA: Insufficient documentation

## 2019-09-26 DIAGNOSIS — R300 Dysuria: Secondary | ICD-10-CM

## 2019-09-26 DIAGNOSIS — R21 Rash and other nonspecific skin eruption: Secondary | ICD-10-CM

## 2019-09-26 LAB — URINALYSIS, COMPLETE (UACMP) WITH MICROSCOPIC
Bilirubin Urine: NEGATIVE
Glucose, UA: NEGATIVE mg/dL
Ketones, ur: NEGATIVE mg/dL
Nitrite: NEGATIVE
Protein, ur: NEGATIVE mg/dL
Specific Gravity, Urine: 1.01 (ref 1.005–1.030)
WBC, UA: >50 WBC/hpf (ref 0–5)
pH: 6.5 (ref 5.0–8.0)

## 2019-09-26 MED ORDER — CEPHALEXIN 500 MG PO CAPS: 500.0000 mg | ORAL_CAPSULE | Freq: Two times a day (BID) | ORAL | 0 refills | Status: AC

## 2019-09-26 NOTE — Progress Notes (Signed)
09/26/2019 11:29 AM   Connie [name] 11/26/1960 [id number]  Referring provider: Jinny Sanders, MD [address] [place],  [place] [address]  Chief Complaint  Patient presents with  . Acute Visit    UTI    HPI: Connie [name] is a 59 y.o. female with a history of nephrolithiasis, UTI's and mixed incontinence who presents today with possible UTI.   Right flank pain Hx of nephrolithiasis - spontaneously passed 30 years ago.  RUS on 12/14/2017 revealed no stones, no hydronephrosis and no masses.  KUB on 12/02/2017 revealed no stones.  UTI's Risk factors for UTI: age, female gender, constipation, incontinence and sugary drinks.   No recent documented infections.  She states over the weekend she had the sudden onset of dysuria and urgency.  She is also having bilateral upper back pain.  She noted some pink-tinged urine once when wiping. Patient denies any modifying or aggravating factors.  Patient denies any gross hematuria, dysuria or suprapubic/flank pain.  Patient denies any fevers, chills, nausea or vomiting.  Her UA today with large leukocytes on dip and greater than 50 WBCs microscopically with few bacteria.  She has also developed a rash on her upper chest.  It is non-pruritic.  She denies taking any new medications, using new detergents or soaps, using new perfume or lotions and sun exposure.  She states that she has had similar rashes in the past when taking NSAIDs for migraine headaches and acetaminophen, but she states she has not had any over-the-counter medications.  Mixed incontinence Risk factors for incontinence: age, high caffeine intake and pelvic floor laxity.  PMH: Past Medical History:  Diagnosis Date  . Acid reflux 5 years  . H/O umbilical hernia repair 3419  . Hx of hysterectomy 2013  . Migraines   . Post partum depression 1986, 1998   W/both children - Tx with antidepressants for second child  . Reflux     Surgical History: Past Surgical  History:  Procedure Laterality Date  . UMBILICAL HERNIA REPAIR  3790   No complications  . Vaginal births  1986, 1998    Home Medications:  Allergies as of 09/26/2019      Reactions   Estrogens Itching   Nitrofurantoin    REACTION: severe reaction - felt like she had the flu   Penicillins    REACTION: as child   Phenazopyridine Hcl Itching, Swelling   REACTION: unspecified   Sulfamethoxazole-trimethoprim Itching   REACTION: unspecified   Ciprofloxacin Rash   REACTION: unspecified      Medication List       Accurate as of September 26, 2019 11:29 AM. If you have any questions, ask your nurse or doctor.        STOP taking these medications   traZODone 50 MG tablet Commonly known as: DESYREL Stopped by: Zara Council, PA-C     TAKE these medications   cephALEXin 500 MG capsule Commonly known as: KEFLEX Take 1 capsule (500 mg total) by mouth 2 (two) times daily for 10 days. Started by: Zara Council, PA-C   Coenzyme Q10 10 MG capsule Take 10 mg by mouth daily.   latanoprost 0.005 % ophthalmic solution Commonly known as: XALATAN PLACE 1 DROP IN EACH EYE DAILY AT BEDTIME   MELATONIN GUMMIES PO Take 4 mg by mouth at bedtime.   multivitamin with minerals tablet Take 1 tablet by mouth daily.   nortriptyline 10 MG capsule Commonly known as: PAMELOR Take 1 capsule (10  mg total) by mouth at bedtime.   propranolol ER 60 MG 24 hr capsule Commonly known as: INDERAL LA Take by mouth.   RA Fish Oil 1000 MG Caps Take 1 capsule by mouth daily.   rizatriptan 10 MG disintegrating tablet Commonly known as: MAXALT-MLT TAKE 1 TAB BY MOUTH ONCE AS NEEDED FOR MIGRAINE**MAY TAKE 2ND DOSE AFTER 2 HRS IF NEEDED       Allergies:  Allergies  Allergen Reactions  . Estrogens Itching  . Nitrofurantoin     REACTION: severe reaction - felt like she had the flu  . Penicillins     REACTION: as child  . Phenazopyridine Hcl Itching and Swelling    REACTION: unspecified  .  Sulfamethoxazole-Trimethoprim Itching    REACTION: unspecified  . Ciprofloxacin Rash    REACTION: unspecified    Family History: Family History  Problem Relation Age of Onset  . Hyperlipidemia Mother   . Fibromyalgia Mother   . Osteoporosis Mother   . Hypertension Mother   . Hyperlipidemia Father   . Heart disease Father 24       MI, Tipple bypass surgery  . Asthma Son   . Allergies Son   . Breast cancer Maternal Grandmother     Social History:  reports that she has never smoked. She has never used smokeless tobacco. She reports that she does not drink alcohol and does not use drugs.  ROS: For pertinent review of systems please refer to history of present illness  Physical Exam: BP 118/80   Pulse 93   Ht 5' 5.5" (1.664 m)   Wt 178 lb (80.7 kg)   BMI 29.17 kg/m   Constitutional:  Well nourished. Alert and oriented, No acute distress. HEENT: [place] AT, mask in place.  Trachea midline Cardiovascular: No clubbing, cyanosis, or edema. Respiratory: Normal respiratory effort, no increased work of breathing. Skin: Rash on upper chest which appears to be urticarial in appearance. Neurologic: Grossly intact, no focal deficits, moving all 4 extremities. Psychiatric: Normal mood and affect.   Laboratory Data: Urinalysis Component     Latest Ref Rng & Units 09/26/2019  Color, Urine     YELLOW YELLOW  Appearance     CLEAR HAZY (A)  Specific Gravity, Urine     1.005 - 1.030 1.010  pH     5.0 - 8.0 6.5  Glucose, UA     NEGATIVE mg/dL NEGATIVE  Hgb urine dipstick     NEGATIVE MODERATE (A)  Bilirubin Urine     NEGATIVE NEGATIVE  Ketones, ur     NEGATIVE mg/dL NEGATIVE  Protein     NEGATIVE mg/dL NEGATIVE  Nitrite     NEGATIVE NEGATIVE  Leukocytes,Ua     NEGATIVE LARGE (A)  Squamous Epithelial / LPF     0 - 5 0-5  WBC, UA     0 - 5 WBC/hpf >50  RBC / HPF     0 - 5 RBC/hpf 0-5  Bacteria, UA     NONE SEEN FEW (A)  WBC Clumps      PRESENT   Lab Results  Component  Value Date   WBC 5.6 09/21/2018   HGB 14.3 09/21/2018   HCT 43.1 09/21/2018   MCV 93.4 09/21/2018   PLT 280.0 09/21/2018    Lab Results  Component Value Date   CREATININE 0.77 09/21/2018    Lab Results  Component Value Date   HGBA1C 5.4 07/30/2018    Lab Results  Component Value Date  TSH 3.52 07/30/2018       Component Value Date/Time   CHOL 166 09/21/2018 0809   HDL 51.80 09/21/2018 0809   CHOLHDL 3 09/21/2018 0809   VLDL 18.6 09/21/2018 0809   LDLCALC 96 09/21/2018 0809    Lab Results  Component Value Date   AST 12 09/21/2018   Lab Results  Component Value Date   ALT 16 09/21/2018   I have reviewed the labs.   Pertinent Imaging: No recent imaging  Assessment & Plan:    1. UTI Patient is symptomatic  UA with > 50 WBC's UA sent for culture- started on Keflex empirically  2. Rash Explained that I was not sure of the rash and if it had any association with her current symptoms or it is a separate issue She will continue to monitor the rash                            Return for pending urine culture .  These notes generated with voice recognition software. I apologize for typographical errors.  Zara Council, PA-C  [facility] [address]  [place], [place] [address] [phone]

## 2019-09-28 ENCOUNTER — Telehealth: Payer: Self-pay

## 2019-09-28 LAB — URINE CULTURE: Culture: >=100000 — AB

## 2019-09-28 NOTE — Telephone Encounter (Signed)
-----   Message from Nori Riis, PA-C sent at 09/28/2019  8:08 AM EDT ----- Please let Mrs. Stephenson know that her urine culture is positive for infection and to finish the Keflex as it is the appropriate antibiotic.

## 2019-09-28 NOTE — Telephone Encounter (Signed)
Patient notified

## 2019-09-28 NOTE — Telephone Encounter (Signed)
mychart notification sent 

## 2019-11-28 ENCOUNTER — Telehealth: Payer: Self-pay | Admitting: *Deleted

## 2019-11-28 ENCOUNTER — Other Ambulatory Visit: Payer: Self-pay

## 2019-11-28 ENCOUNTER — Encounter: Payer: Self-pay | Admitting: Internal Medicine

## 2019-11-28 ENCOUNTER — Ambulatory Visit (INDEPENDENT_AMBULATORY_CARE_PROVIDER_SITE_OTHER): Payer: 59 | Admitting: Internal Medicine

## 2019-11-28 VITALS — BP 132/84 | HR 78 | Temp 97.1°F | Wt 178.0 lb

## 2019-11-28 DIAGNOSIS — Z566 Other physical and mental strain related to work: Secondary | ICD-10-CM | POA: Diagnosis not present

## 2019-11-28 DIAGNOSIS — Z23 Encounter for immunization: Secondary | ICD-10-CM | POA: Diagnosis not present

## 2019-11-28 DIAGNOSIS — R030 Elevated blood-pressure reading, without diagnosis of hypertension: Secondary | ICD-10-CM | POA: Diagnosis not present

## 2019-11-28 DIAGNOSIS — R002 Palpitations: Secondary | ICD-10-CM

## 2019-11-28 MED ORDER — HYDROXYZINE HCL 10 MG PO TABS
10.0000 mg | ORAL_TABLET | Freq: Every day | ORAL | 0 refills | Status: DC | PRN
Start: 2019-11-28 — End: 2023-07-07

## 2019-11-28 NOTE — Telephone Encounter (Signed)
Will discuss at upcoming appt.

## 2019-11-28 NOTE — Telephone Encounter (Signed)
Patient called stating that she checked her blood pressure and it was 176/102 heart rate 107.  Patient stated that she has a history of migraines and she woke up with a migraine and took her medication which has helped some. Patient stated that she has had a lot of stress at work and felt that she was having a panic attack this morning. Patient denies any covid symptoms other than a headache. Patient stated that she has had the covid vaccines. Patient checked her blood pressure while on the phone and it was 148/89 heart rate 110. Patient scheduled to see Webb Silversmith NP today at 12:00 noon.

## 2019-11-28 NOTE — Patient Instructions (Signed)
Stress, Adult Stress is a normal reaction to life events. Stress is what you feel when life demands more than you are used to, or more than you think you can handle. Some stress can be useful, such as studying for a test or meeting a deadline at work. Stress that occurs too often or for too long can cause problems. It can affect your emotional health and interfere with relationships and normal daily activities. Too much stress can weaken your body's defense system (immune system) and increase your risk for physical illness. If you already have a medical problem, stress can make it worse. What are the causes? All sorts of life events can cause stress. An event that causes stress for one person may not be stressful for another person. Major life events, whether positive or negative, commonly cause stress. Examples include:  Losing a job or starting a new job.  Losing a loved one.  Moving to a new town or home.  Getting married or divorced.  Having a baby.  Getting injured or sick. Less obvious life events can also cause stress, especially if they occur day after day or in combination with each other. Examples include:  Working long hours.  Driving in traffic.  Caring for children.  Being in debt.  Being in a difficult relationship. What are the signs or symptoms? Stress can cause emotional symptoms, including:  Anxiety. This is feeling worried, afraid, on edge, overwhelmed, or out of control.  Anger, including irritation or impatience.  Depression. This is feeling sad, down, helpless, or guilty.  Trouble focusing, remembering, or making decisions. Stress can cause physical symptoms, including:  Aches and pains. These may affect your head, neck, back, stomach, or other areas of your body.  Tight muscles or a clenched jaw.  Low energy.  Trouble sleeping. Stress can cause unhealthy behaviors, including:  Eating to feel better (overeating) or skipping meals.  Working too  much or putting off tasks.  Smoking, drinking alcohol, or using drugs to feel better. How is this diagnosed? Stress is diagnosed through an assessment by your health care provider. He or she may diagnose this condition based on:  Your symptoms and any stressful life events.  Your medical history.  Tests to rule out other causes of your symptoms. Depending on your condition, your health care provider may refer you to a specialist for further evaluation. How is this treated?  Stress management techniques are the recommended treatment for stress. Medicine is not typically recommended for the treatment of stress. Techniques to reduce your reaction to stressful life events include:  Stress identification. Monitor yourself for symptoms of stress and identify what causes stress for you. These skills may help you to avoid or prepare for stressful events.  Time management. Set your priorities, keep a calendar of events, and learn to say no. Taking these actions can help you avoid making too many commitments. Techniques for coping with stress include:  Rethinking the problem. Try to think realistically about stressful events rather than ignoring them or overreacting. Try to find the positives in a stressful situation rather than focusing on the negatives.  Exercise. Physical exercise can release both physical and emotional tension. The key is to find a form of exercise that you enjoy and do it regularly.  Relaxation techniques. These relax the body and mind. The key is to find one or more that you enjoy and use the techniques regularly. Examples include: ? Meditation, deep breathing, or progressive relaxation techniques. ? Yoga or   tai chi. ? Biofeedback, mindfulness techniques, or journaling. ? Listening to music, being out in nature, or participating in other hobbies.  Practicing a healthy lifestyle. Eat a balanced diet, drink plenty of water, limit or avoid caffeine, and get plenty of  sleep.  Having a strong support network. Spend time with family, friends, or other people you enjoy being around. Express your feelings and talk things over with someone you trust. Counseling or talk therapy with a mental health professional may be helpful if you are having trouble managing stress on your own. Follow these instructions at home: Lifestyle   Avoid drugs.  Do not use any products that contain nicotine or tobacco, such as cigarettes, e-cigarettes, and chewing tobacco. If you need help quitting, ask your health care provider.  Limit alcohol intake to no more than 1 drink a day for nonpregnant women and 2 drinks a day for men. One drink equals 12 oz of beer, 5 oz of wine, or 1 oz of hard liquor  Do not use alcohol or drugs to relax.  Eat a balanced diet that includes fresh fruits and vegetables, whole grains, lean meats, fish, eggs, and beans, and low-fat dairy. Avoid processed foods and foods high in added fat, sugar, and salt.  Exercise at least 30 minutes on 5 or more days each week.  Get 7-8 hours of sleep each night. General instructions   Practice stress management techniques as discussed with your health care provider.  Drink enough fluid to keep your urine clear or pale yellow.  Take over-the-counter and prescription medicines only as told by your health care provider.  Keep all follow-up visits as told by your health care provider. This is important. Contact a health care provider if:  Your symptoms get worse.  You have new symptoms.  You feel overwhelmed by your problems and can no longer manage them on your own. Get help right away if:  You have thoughts of hurting yourself or others. If you ever feel like you may hurt yourself or others, or have thoughts about taking your own life, get help right away. You can go to your nearest emergency department or call:  Your local emergency services (911 in the [place]  A suicide crisis helpline, such as the  [place] at (316) 250-6172. This is open 24 hours a day. Summary  Stress is a normal reaction to life events. It can cause problems if it happens too often or for too long.  Practicing stress management techniques is the best way to treat stress.  Counseling or talk therapy with a mental health professional may be helpful if you are having trouble managing stress on your own. This information is not intended to replace advice given to you by your health care provider. Make sure you discuss any questions you have with your health care provider. Document Revised: 10/01/2018 Document Reviewed: 04/23/2016 Elsevier Patient Education  [place].

## 2019-11-28 NOTE — Progress Notes (Signed)
Subjective:    Patient ID: Connie [name], female    DOB: [birth year], 59 y.o.   MRN: [id number]  HPI  Pt presents to the clinic today with c/o elevated blood pressure. Her BP earlier today was 170/102. At that time, she had a slight headache, denied visual changes or dizziness. She did feel like her heart was racing and mildly short of breath. She felt like this may have been triggered by stress at work. She has never been diagnosed with HTN or anxiety in the past. She was able to distract herself and do some deep breathing exercises which did seem to help. She reports history of chronic tension in her upper back, difficulty sleeping and headaches. She has tried Nortriptyline, Topamax, Lunesta, and Ambien in the past.   Review of Systems      Past Medical History:  Diagnosis Date  . Acid reflux 5 years  . H/O umbilical hernia repair 5643  . Hx of hysterectomy 2013  . Migraines   . Post partum depression 1986, 1998   W/both children - Tx with antidepressants for second child  . Reflux     Current Outpatient Medications  Medication Sig Dispense Refill  . Coenzyme Q10 10 MG capsule Take 10 mg by mouth daily.     Marland Kitchen latanoprost (XALATAN) 0.005 % ophthalmic solution PLACE 1 DROP IN [facility] EYE DAILY AT BEDTIME    . MELATONIN GUMMIES PO Take 4 mg by mouth at bedtime.    . Multiple Vitamins-Minerals (MULTIVITAMIN WITH MINERALS) tablet Take 1 tablet by mouth daily.    . nortriptyline (PAMELOR) 10 MG capsule Take 1 capsule (10 mg total) by mouth at bedtime. 30 capsule 3  . Omega-3 Fatty Acids (RA FISH OIL) 1000 MG CAPS Take 1 capsule by mouth daily.     . propranolol ER (INDERAL LA) 60 MG 24 hr capsule Take by mouth.    . rizatriptan (MAXALT-MLT) 10 MG disintegrating tablet TAKE 1 TAB BY MOUTH ONCE AS NEEDED FOR MIGRAINE**MAY TAKE 2ND DOSE AFTER 2 HRS IF NEEDED     No current facility-administered medications for this visit.    Allergies  Allergen Reactions  . Estrogens Itching  .  Nitrofurantoin     REACTION: severe reaction - felt like she had the flu  . Penicillins     REACTION: as child  . Phenazopyridine Hcl Itching and Swelling    REACTION: unspecified  . Sulfamethoxazole-Trimethoprim Itching    REACTION: unspecified  . Ciprofloxacin Rash    REACTION: unspecified    Family History  Problem Relation Age of Onset  . Hyperlipidemia Mother   . Fibromyalgia Mother   . Osteoporosis Mother   . Hypertension Mother   . Hyperlipidemia Father   . Heart disease Father 108       MI, Tipple bypass surgery  . Asthma Son   . Allergies Son   . Breast cancer Maternal Grandmother     Social History   Socioeconomic History  . Marital status: Married    Spouse name: Not on file  . Number of children: Not on file  . Years of education: Not on file  . Highest education level: Not on file  Occupational History  . 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 Smoking status: Never Smoker  . Smokeless tobacco: Never Used  Substance and Sexual Activity  . Alcohol use: No  . Drug use: No  . Sexual activity: Not on file  Other Topics Concern  . Not on file  Social History Narrative   Married 25 years - Husband is healthy   Regular Exercise -  YES 1-2 times a week (10-30 min) and outdoor activities in the spring and summer. Healthy diet with fruits and vegetables.         Social Determinants of Health   Financial Resource Strain:   . Difficulty of Paying Living Expenses: Not on file  Food Insecurity:   . Worried About Charity fundraiser in the Last Year: Not on file  . Ran Out of Food in the Last Year: Not on file  Transportation Needs:   . Lack of Transportation (Medical): Not on file  . Lack of Transportation (Non-Medical): Not on file  Physical Activity:   . Days of Exercise per Week: Not on file  . Minutes of Exercise per Session: Not on file  Stress:   . Feeling of Stress : Not on file  Social Connections:   .  Frequency of Communication with Friends and Family: Not on file  . Frequency of Social Gatherings with Friends and Family: Not on file  . Attends Religious Services: Not on file  . Active Member of Clubs or Organizations: Not on file  . Attends Archivist Meetings: Not on file  . Marital Status: Not on file  Intimate Partner Violence:   . Fear of Current or Ex-Partner: Not on file  . Emotionally Abused: Not on file  . Physically Abused: Not on file  . Sexually Abused: Not on file     Constitutional: Denies fever, malaise, fatigue, headache or abrupt weight changes.  HEENT: Denies eye pain, eye redness, ear pain, ringing in the ears, wax buildup, runny nose, nasal congestion, bloody nose, or sore throat. Respiratory: Pt reports mild shortness of breath. Denies difficulty breathing,  cough or sputum production.   Cardiovascular: Pt reports palpitations. Denies chest pain, chest tightness, or swelling in the hands or feet.  Gastrointestinal: Denies abdominal pain, bloating, constipation, diarrhea or blood in the stool.  Neurological: Denies dizziness, difficulty with memory, difficulty with speech or problems with balance and coordination.  Psych: Pt reports stress. Denies depression, SI/HI.  No other specific complaints in a complete review of systems (except as listed in HPI above).  Objective:   Physical Exam BP 132/84   Pulse 78   Temp (!) 97.1 F (36.2 C) (Temporal)   Wt 178 lb (80.7 kg)   SpO2 98%   BMI 29.17 kg/m   Wt Readings from Last 3 Encounters:  09/26/19 178 lb (80.7 kg)  09/23/18 181 lb 5 oz (82.2 kg)  08/03/18 179 lb 12 oz (81.5 kg)    General: Appears herstated age, well developed, well nourished in NAD. HEENT: Head: normal shape and size; Eyes: sclera white, no icterus, conjunctiva pink, PERRLA and EOMs intact;  Cardiovascular: Normal rate and rhythm. S1,S2 noted.  No murmur, rubs or gallops noted. No JVD or BLE edema.  Pulmonary/Chest: Normal  effort and positive vesicular breath sounds. No respiratory distress. No wheezes, rales or ronchi noted.  Neurological: Alert and oriented. Coordination normal.  Psychiatric: Mood and affect normal. Behavior is normal. Judgment and thought content normal.    BMET    Component Value Date/Time   NA 140 09/21/2018 0809   K 3.8 09/21/2018 0809   CL 104 09/21/2018 0809   CO2 30 09/21/2018 0809   GLUCOSE 87 09/21/2018 0809   BUN 11 09/21/2018 0809   CREATININE 0.77  09/21/2018 0809   CALCIUM 8.5 09/21/2018 0809   GFRNONAA >60 05/02/2018 1145   GFRAA >60 05/02/2018 1145    Lipid Panel     Component Value Date/Time   CHOL 166 09/21/2018 0809   TRIG 93.0 09/21/2018 0809   HDL 51.80 09/21/2018 0809   CHOLHDL 3 09/21/2018 0809   VLDL 18.6 09/21/2018 0809   LDLCALC 96 09/21/2018 0809    CBC    Component Value Date/Time   WBC 5.6 09/21/2018 0809   RBC 4.61 09/21/2018 0809   HGB 14.3 09/21/2018 0809   HCT 43.1 09/21/2018 0809   PLT 280.0 09/21/2018 0809   MCV 93.4 09/21/2018 0809   MCH 30.3 05/02/2018 1145   MCHC 33.2 09/21/2018 0809   RDW 13.5 09/21/2018 0809   LYMPHSABS 1.2 09/21/2018 0809   MONOABS 0.3 09/21/2018 0809   EOSABS 0.3 09/21/2018 0809   BASOSABS 0.1 09/21/2018 0809    Hgb A1C Lab Results  Component Value Date   HGBA1C 5.4 07/30/2018          Assessment & Plan:   Elevated Blood Pressure, Palpitations, Stress:  Exam benign, VSS today Likely anxiety related RX for Hydroxyzine 10 mg daily prn- sedation caution given Encouraged continued CBT such as distraction and deep breathing  She will follow up with PCP if symptoms persist or worsen Webb Silversmith, NP This visit occurred during the SARS-CoV-2 public health emergency.  Safety protocols were in place, including screening questions prior to the visit, additional usage of staff PPE, and extensive cleaning of exam room while observing appropriate contact time as indicated for disinfecting solutions.

## 2019-12-20 ENCOUNTER — Other Ambulatory Visit: Payer: Self-pay | Admitting: Internal Medicine

## 2020-01-03 ENCOUNTER — Other Ambulatory Visit: Payer: Self-pay | Admitting: Internal Medicine

## 2020-04-01 IMAGING — CT CT HEAD W/O CM
4 series · 16 of 47 positions shown, 18 images · non-contrast
Comparison: None.

CLINICAL DATA: Dizziness with headache beginning today.

EXAM:
CT HEAD WITHOUT CONTRAST
TECHNIQUE: Contiguous axial images were obtained from the base of the skull
through the vertex without intravenous contrast.

[Series 3: head wo · axial · 0.42mm/px · z∈[-188,-73]mm · 7 of 31 slices shown, 9 images]
[im 4/31  brain]
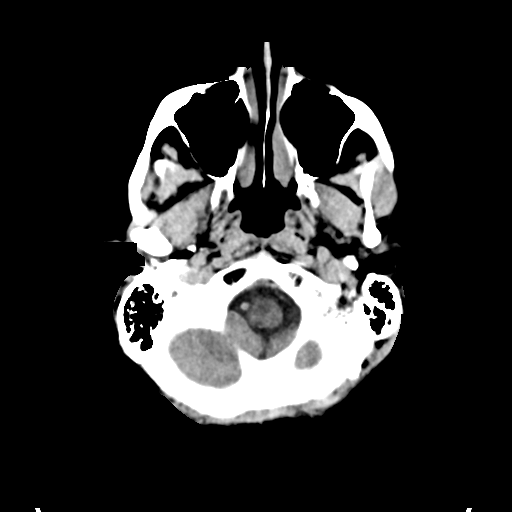
[im 4/31  bone]
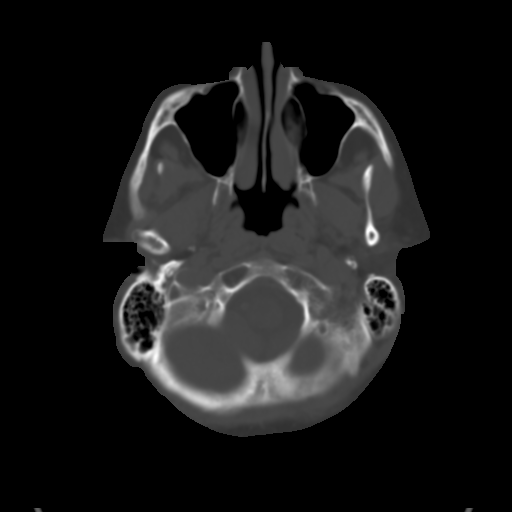
[im 8/31  brain]
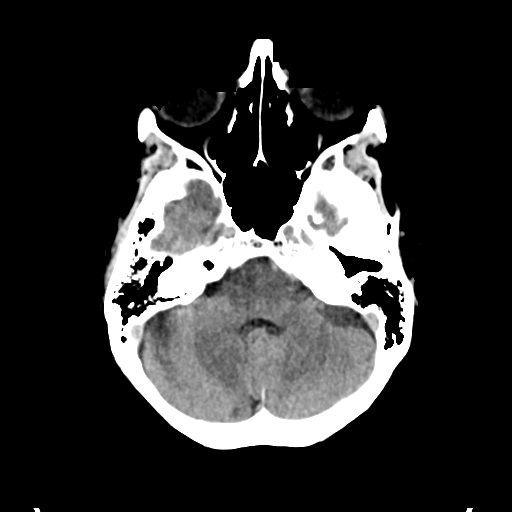
[im 12/31  brain]
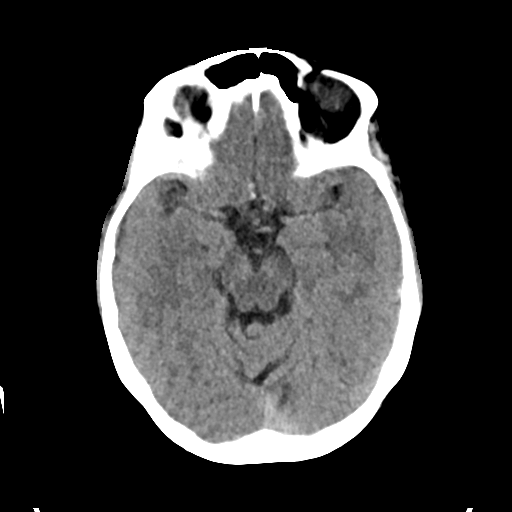
[im 16/31  brain]
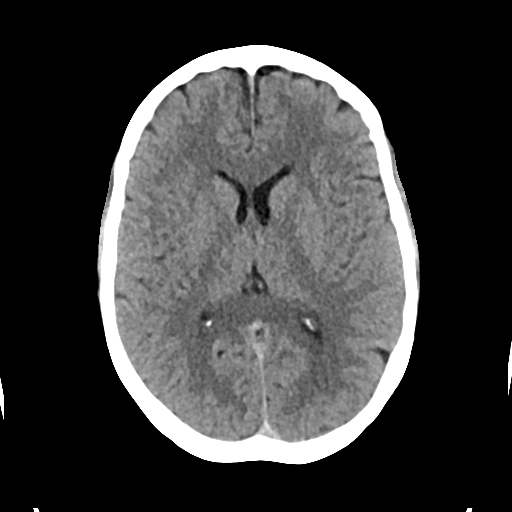
[im 19/31  brain]
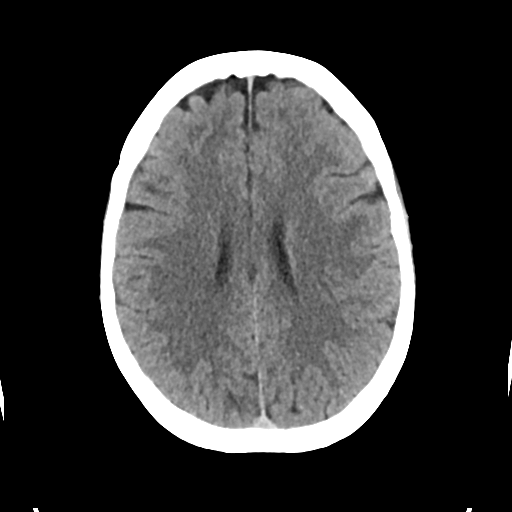
[im 19/31  bone]
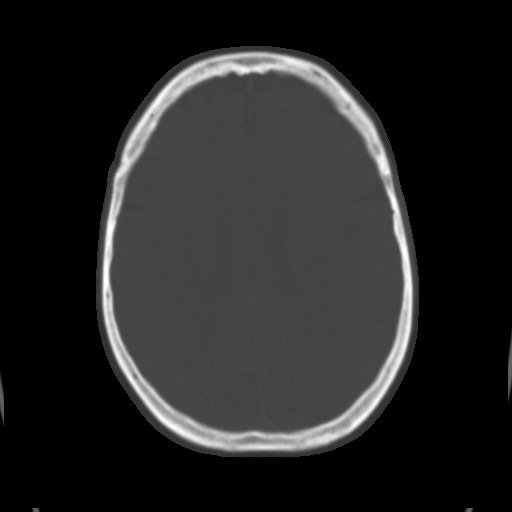
[im 23/31  brain]
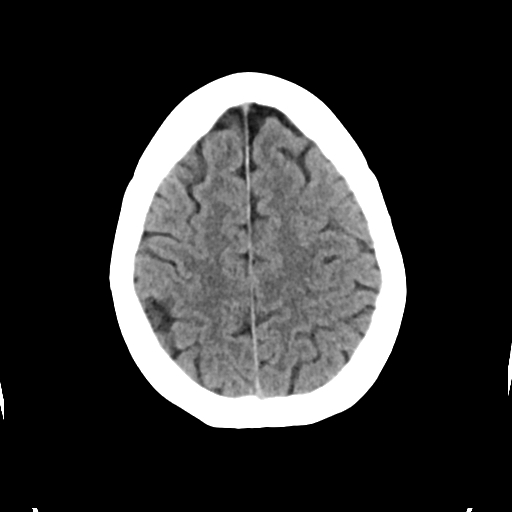
[im 27/31  brain]
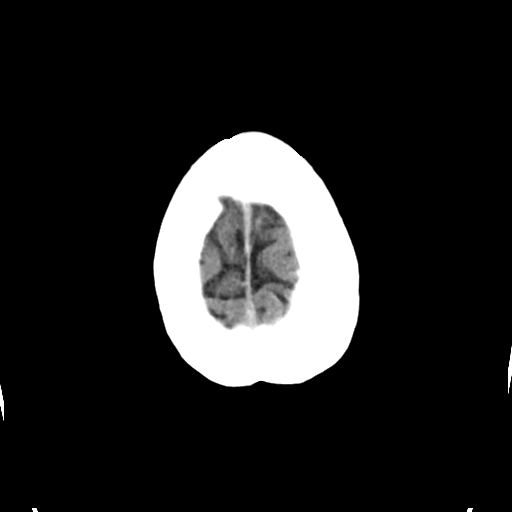

[Series 4: head bone · axial · 0.42mm/px · z∈[-189,-159]mm · 3 of 76 slices shown]
[im 8/76  bone]
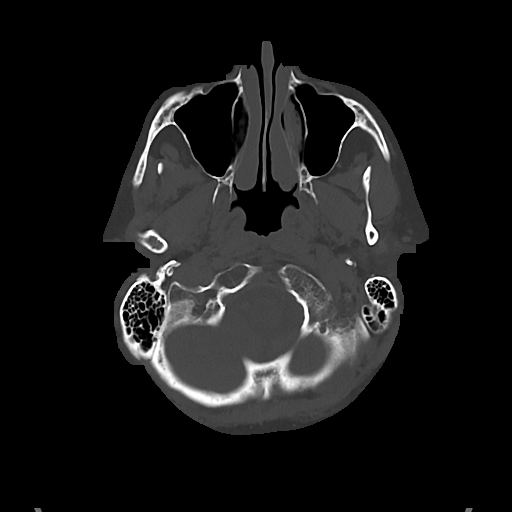
[im 16/76  bone]
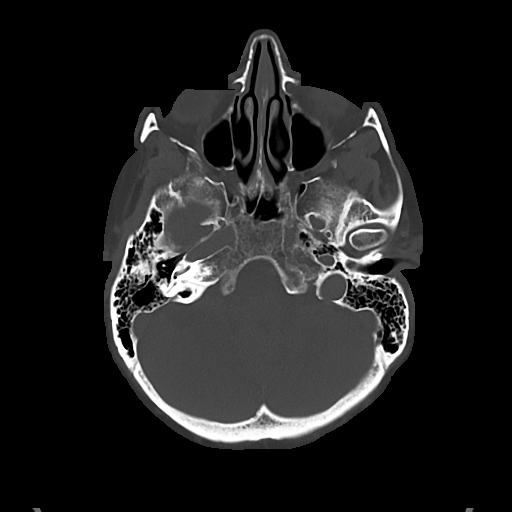
[im 23/76  bone]
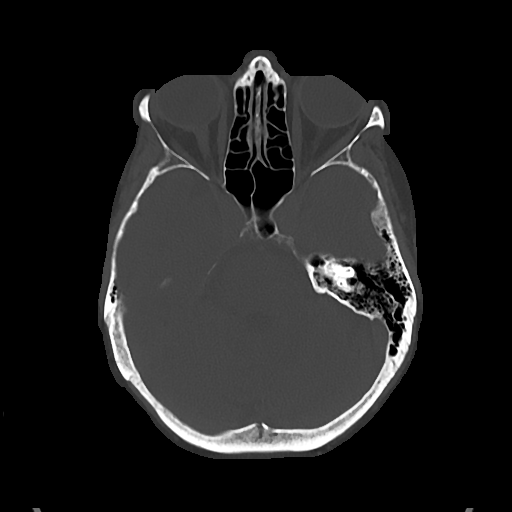

[Series 5: cor soft · coronal · 0.33mm/px · 3 of 61 slices shown]
[im 21/61  brain]
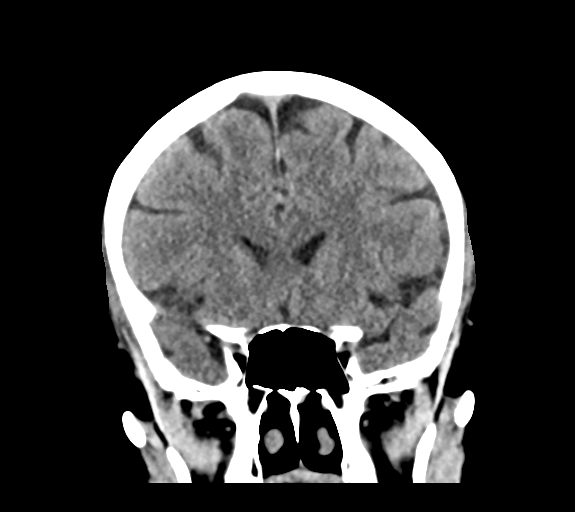
[im 27/61  brain]
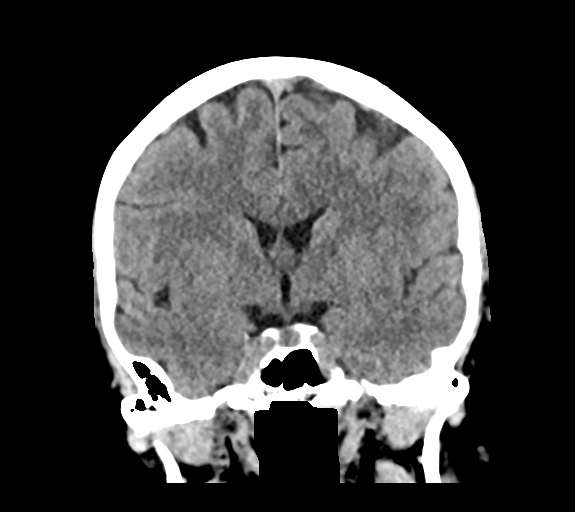
[im 34/61  brain]
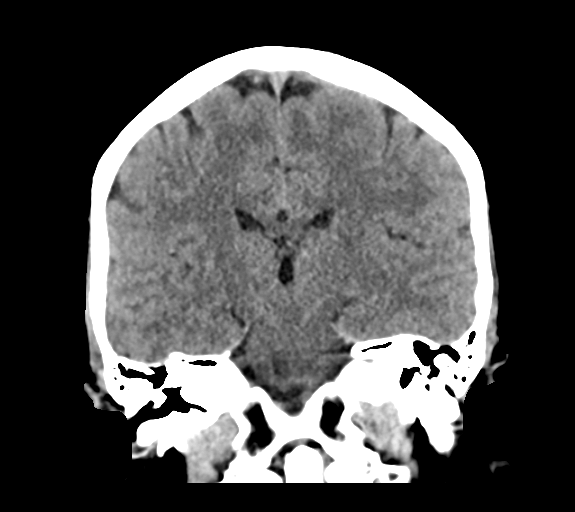

[Series 6: sag soft · sagittal · 0.33mm/px · 3 of 48 slices shown]
[im 16/48  brain]
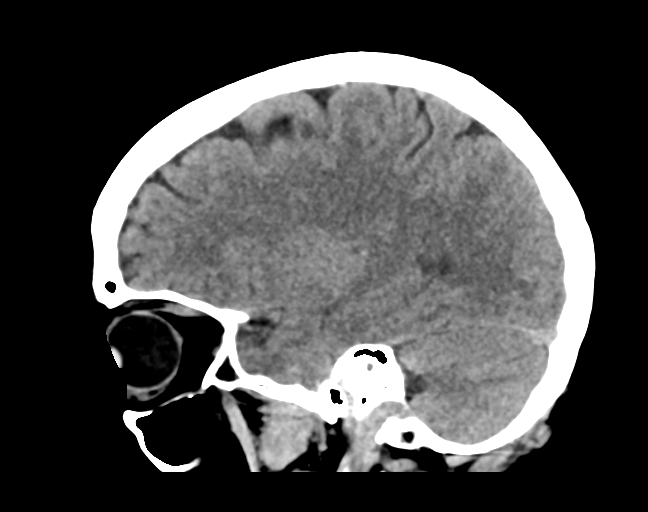
[im 24/48  brain]
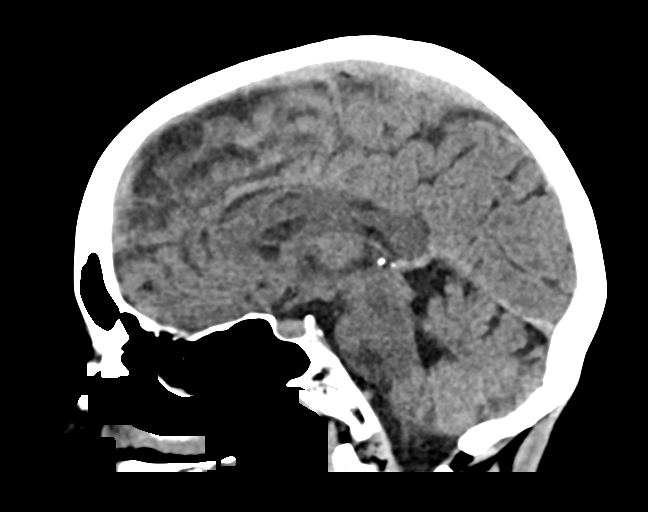
[im 32/48  brain]
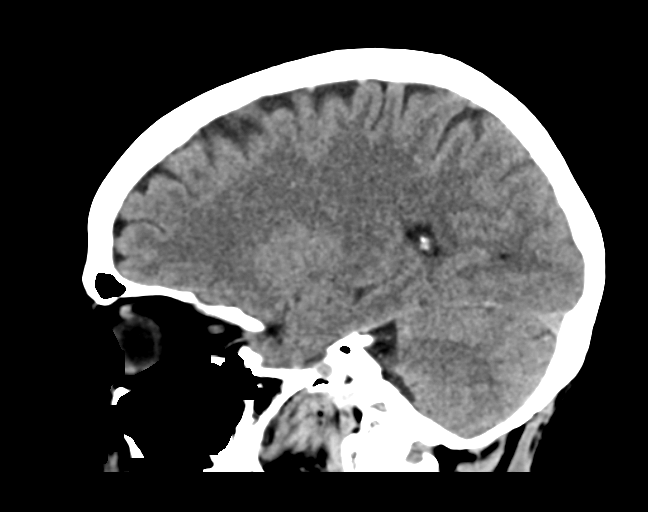

[16 of 47 positions shown; findings below may reference images not displayed]

FINDINGS: Brain: There is no evidence of acute infarct, intracranial
hemorrhage, mass, midline shift, or extra-axial fluid collection.
The ventricles and sulci are normal.

Vascular: No hyperdense vessel.

Skull: No fracture or focal osseous lesion.

Sinuses/Orbits: Visualized paranasal sinuses and mastoid air cells
are clear. Orbits are unremarkable.

Other: None.
IMPRESSION: Negative head CT.

## 2021-01-14 ENCOUNTER — Other Ambulatory Visit: Payer: Self-pay | Admitting: Obstetrics and Gynecology

## 2021-01-14 DIAGNOSIS — R928 Other abnormal and inconclusive findings on diagnostic imaging of breast: Secondary | ICD-10-CM

## 2021-01-16 ENCOUNTER — Other Ambulatory Visit: Payer: Self-pay | Admitting: Obstetrics and Gynecology

## 2021-01-16 ENCOUNTER — Ambulatory Visit
Admission: RE | Admit: 2021-01-16 | Discharge: 2021-01-16 | Disposition: A | Discharge status: Home / Self Care | Payer: 59 | Source: Ambulatory Visit | Attending: Obstetrics and Gynecology | Admitting: Obstetrics and Gynecology

## 2021-01-16 ENCOUNTER — Other Ambulatory Visit: Payer: Self-pay

## 2021-01-16 DIAGNOSIS — R2232 Localized swelling, mass and lump, left upper limb: Secondary | ICD-10-CM

## 2021-01-16 DIAGNOSIS — R928 Other abnormal and inconclusive findings on diagnostic imaging of breast: Secondary | ICD-10-CM

## 2021-01-28 ENCOUNTER — Ambulatory Visit
Admission: RE | Admit: 2021-01-28 | Discharge: 2021-01-28 | Disposition: A | Discharge status: Home / Self Care | Payer: 59 | Source: Ambulatory Visit | Attending: Obstetrics and Gynecology | Admitting: Obstetrics and Gynecology

## 2021-01-28 ENCOUNTER — Other Ambulatory Visit: Payer: Self-pay

## 2021-01-28 DIAGNOSIS — R928 Other abnormal and inconclusive findings on diagnostic imaging of breast: Secondary | ICD-10-CM

## 2021-01-28 HISTORY — PX: BREAST BIOPSY: SHX20

## 2021-02-18 ENCOUNTER — Ambulatory Visit: Payer: Self-pay | Admitting: Surgery

## 2021-06-06 ENCOUNTER — Other Ambulatory Visit: Payer: Self-pay | Admitting: Surgery

## 2021-06-06 DIAGNOSIS — R928 Other abnormal and inconclusive findings on diagnostic imaging of breast: Secondary | ICD-10-CM

## 2021-09-04 ENCOUNTER — Ambulatory Visit
Admission: RE | Admit: 2021-09-04 | Discharge: 2021-09-04 | Disposition: A | Discharge status: Home / Self Care | Payer: 59 | Source: Ambulatory Visit | Attending: Surgery | Admitting: Surgery

## 2021-09-04 ENCOUNTER — Other Ambulatory Visit: Payer: Self-pay | Admitting: Surgery

## 2021-09-04 DIAGNOSIS — R928 Other abnormal and inconclusive findings on diagnostic imaging of breast: Secondary | ICD-10-CM

## 2022-06-04 ENCOUNTER — Other Ambulatory Visit: Payer: Self-pay | Admitting: Obstetrics and Gynecology

## 2022-06-04 DIAGNOSIS — N6310 Unspecified lump in the right breast, unspecified quadrant: Secondary | ICD-10-CM

## 2022-07-23 ENCOUNTER — Ambulatory Visit
Admission: RE | Admit: 2022-07-23 | Discharge: 2022-07-23 | Disposition: A | Discharge status: Home / Self Care | Payer: 59 | Source: Ambulatory Visit | Attending: Obstetrics and Gynecology | Admitting: Obstetrics and Gynecology

## 2022-07-23 DIAGNOSIS — N6310 Unspecified lump in the right breast, unspecified quadrant: Secondary | ICD-10-CM

## 2023-05-08 ENCOUNTER — Telehealth: Payer: Self-pay | Admitting: Family Medicine

## 2023-05-08 NOTE — Telephone Encounter (Signed)
Copied from CRM [id number]. Topic: Appointments - Transfer of Care >> May 08, 2023 11:30 AM [place] A wrote: Pt is requesting to transfer FROM: Connie [name] Pt is requesting to transfer TO: Algernon Huxley Reason for requested transfer: Has not had Primary in five years It is the responsibility of the team the patient would like to transfer to (Dr. Steele Berg) to reach out to the patient if for any reason this transfer is not acceptable.

## 2023-05-11 ENCOUNTER — Other Ambulatory Visit: Payer: Self-pay | Admitting: Obstetrics and Gynecology

## 2023-05-11 DIAGNOSIS — Z09 Encounter for follow-up examination after completed treatment for conditions other than malignant neoplasm: Secondary | ICD-10-CM

## 2023-06-18 LAB — HM COLONOSCOPY

## 2023-07-07 ENCOUNTER — Encounter: Payer: Self-pay | Admitting: Family Medicine

## 2023-07-07 ENCOUNTER — Ambulatory Visit (INDEPENDENT_AMBULATORY_CARE_PROVIDER_SITE_OTHER): Payer: 59 | Admitting: Family Medicine

## 2023-07-07 VITALS — BP 124/82 | HR 88 | Temp 98.0°F | Ht 65.5 in | Wt 196.0 lb

## 2023-07-07 DIAGNOSIS — Z6832 Body mass index (BMI) 32.0-32.9, adult: Secondary | ICD-10-CM

## 2023-07-07 DIAGNOSIS — R5383 Other fatigue: Secondary | ICD-10-CM | POA: Diagnosis not present

## 2023-07-07 DIAGNOSIS — G43009 Migraine without aura, not intractable, without status migrainosus: Secondary | ICD-10-CM

## 2023-07-07 DIAGNOSIS — E66811 Obesity, class 1: Secondary | ICD-10-CM | POA: Diagnosis not present

## 2023-07-07 LAB — LIPID PANEL
Cholesterol: 203 mg/dL — ABNORMAL HIGH (ref 0–200)
HDL: 55.5 mg/dL (ref >39.00)
LDL Cholesterol: 128 mg/dL — ABNORMAL HIGH (ref 0–99)
NonHDL: 147.26
Total CHOL/HDL Ratio: 4
Triglycerides: 94 mg/dL (ref 0.0–149.0)
VLDL: 18.8 mg/dL (ref 0.0–40.0)

## 2023-07-07 LAB — COMPREHENSIVE METABOLIC PANEL WITH GFR
ALT: 16 U/L (ref 0–35)
AST: 16 U/L (ref 0–37)
Albumin: 4.2 g/dL (ref 3.5–5.2)
Alkaline Phosphatase: 52 U/L (ref 39–117)
BUN: 15 mg/dL (ref 6–23)
CO2: 29 meq/L (ref 19–32)
Calcium: 9.3 mg/dL (ref 8.4–10.5)
Chloride: 103 meq/L (ref 96–112)
Creatinine, Ser: 0.77 mg/dL (ref 0.40–1.20)
GFR: 83.44 mL/min (ref >60.00)
Glucose, Bld: 98 mg/dL (ref 70–99)
Potassium: 4 meq/L (ref 3.5–5.1)
Sodium: 139 meq/L (ref 135–145)
Total Bilirubin: 0.4 mg/dL (ref 0.2–1.2)
Total Protein: 6.9 g/dL (ref 6.0–8.3)

## 2023-07-07 LAB — CBC WITH DIFFERENTIAL/PLATELET
Basophils Absolute: 0 10*3/uL (ref 0.0–0.1)
Basophils Relative: 0.9 % (ref 0.0–3.0)
Eosinophils Absolute: 0.3 10*3/uL (ref 0.0–0.7)
Eosinophils Relative: 5.4 % — ABNORMAL HIGH (ref 0.0–5.0)
HCT: 47.2 % — ABNORMAL HIGH (ref 36.0–46.0)
Hemoglobin: 15.6 g/dL — ABNORMAL HIGH (ref 12.0–15.0)
Lymphocytes Relative: 17.5 % (ref 12.0–46.0)
Lymphs Abs: 1 10*3/uL (ref 0.7–4.0)
MCHC: 33.1 g/dL (ref 30.0–36.0)
MCV: 93 fl (ref 78.0–100.0)
Monocytes Absolute: 0.4 10*3/uL (ref 0.1–1.0)
Monocytes Relative: 6.6 % (ref 3.0–12.0)
Neutro Abs: 4 10*3/uL (ref 1.4–7.7)
Neutrophils Relative %: 69.6 % (ref 43.0–77.0)
Platelets: 297 10*3/uL (ref 150.0–400.0)
RBC: 5.07 Mil/uL (ref 3.87–5.11)
RDW: 13.7 % (ref 11.5–15.5)
WBC: 5.7 10*3/uL (ref 4.0–10.5)

## 2023-07-07 LAB — FERRITIN: Ferritin: 62.5 ng/mL (ref 10.0–291.0)

## 2023-07-07 LAB — T4, FREE: Free T4: 1.01 ng/dL (ref 0.60–1.60)

## 2023-07-07 LAB — HEMOGLOBIN A1C: Hgb A1c MFr Bld: 5.5 % (ref 4.6–6.5)

## 2023-07-07 LAB — VITAMIN D 25 HYDROXY (VIT D DEFICIENCY, FRACTURES): VITD: 40.23 ng/mL (ref 30.00–100.00)

## 2023-07-07 LAB — VITAMIN B12: Vitamin B-12: 290 pg/mL (ref 211–911)

## 2023-07-07 LAB — FOLATE: Folate: 16.5 ng/mL (ref >5.9)

## 2023-07-07 LAB — TSH: TSH: 5.36 u[IU]/mL (ref 0.35–5.50)

## 2023-07-07 NOTE — Progress Notes (Signed)
 New Patient Office Visit  Subjective    Patient ID: Connie [name], female    DOB: [birth year]  Age: 63 y.o. MRN: [id number]  CC:  Chief Complaint  Patient presents with   Establish Care    Wants to discuss feeling tired more than normal, especially after working out or doing something. Menopausal weight gain    HPI Connie [name] presents to establish care  Dr. Asencion Blacksmith at OB/GYN   Hx of migraine headaches. Takes rizatriptan 5-6 times per month.  Allergies trigger headaches.  Recently started seeing [facility], Dr. Daron Ellen for refills.  She used to see neurology for migraines.   Sees eye doctor at [facility].  Has elevated eye pressure.   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  She stopped soda and lowered sugar intake.  Eating more vegetables and protein.  Cravings in afternoon and late at night. Snacks   She loses and regains her weight.  Exercises at [employer]. Water classes.   She had labs through her employer in September 2024  Hx of HLD.   C/o fatigue.  Never been tested for sleep apnea.   She has taken topiramate  in the past and she did not like side effects.  She also took propranolol for headaches in the past.    Outpatient Encounter Medications as of 07/07/2023  Medication Sig   escitalopram (LEXAPRO) 10 MG tablet Take 10 mg by mouth daily.   esomeprazole (NEXIUM) 20 MG capsule Take 20 mg by mouth daily at 12 noon.   estradiol (ESTRACE) 0.1 MG/GM vaginal cream Place 1 Applicatorful vaginally at bedtime.   latanoprost (XALATAN) 0.005 % ophthalmic solution PLACE 1 DROP IN EACH EYE DAILY AT BEDTIME   Multiple Vitamins-Minerals (MULTIVITAMIN WITH MINERALS) tablet Take 1 tablet by mouth daily.   rizatriptan (MAXALT-MLT) 10 MG disintegrating tablet TAKE 1 TAB BY MOUTH ONCE AS NEEDED FOR MIGRAINE**MAY TAKE 2ND DOSE AFTER 2 HRS IF NEEDED   [DISCONTINUED] hydrOXYzine  (ATARAX /VISTARIL )  10 MG tablet Take 1 tablet (10 mg total) by mouth daily as needed.   No facility-administered encounter medications on file as of 07/07/2023.    Past Medical History:  Diagnosis Date   Acid reflux 5 years   H/O umbilical hernia repair 2007   Hx of hysterectomy 2013   Migraines    Post partum depression 1986, 1998   W/both children - Tx with antidepressants for second child   Reflux     Past Surgical History:  Procedure Laterality Date   ABDOMINAL HYSTERECTOMY     BREAST BIOPSY Right 01/28/2021   EYE SURGERY  October 2023   Office based retinal tear laser repair   HERNIA REPAIR     UMBILICAL HERNIA REPAIR  03/17/2004   No complications   Vaginal births  1986, 40    Family History  Problem Relation Age of Onset   Hyperlipidemia Mother    Fibromyalgia Mother    Osteoporosis Mother    Hypertension Mother    Hyperlipidemia Father    Heart disease Father 75       MI, Tipple bypass surgery   Breast cancer Maternal Grandmother        72s   Asthma Son    Allergies Son    Breast cancer Other 39       great maternal aunt    Social History   Socioeconomic History   Marital status:  Married    Spouse name: Not on file   Number of children: Not on file   Years of education: Not on file   Highest education level: Associate degree: occupational, Scientist, product/process development, or vocational program  Occupational History   Occupation: Adult nurse: Advertising copywriter  Tobacco Use   Smoking status: Never   Smokeless tobacco: Never  Substance and Sexual Activity   Alcohol use: No   Drug use: No   Sexual activity: Not on file  Other Topics Concern   Not on file  Social History Narrative   Married 25 years - Husband is healthy   Regular Exercise -  YES 1-2 times a week (10-30 min) and outdoor activities in the spring and summer. Healthy diet with fruits and vegetables.         Social Drivers of Corporate investment banker Strain: Low Risk  (07/06/2023)   Overall  Financial Resource Strain (CARDIA)    Difficulty of Paying Living Expenses: Not hard at all  Food Insecurity: No Food Insecurity (07/06/2023)   Hunger Vital Sign    Worried About Running Out of Food in the Last Year: Never true    Ran Out of Food in the Last Year: Never true  Transportation Needs: No Transportation Needs (07/06/2023)   PRAPARE - Administrator, Civil Service (Medical): No    Lack of Transportation (Non-Medical): No  Physical Activity: Sufficiently Active (07/06/2023)   Exercise Vital Sign    Days of Exercise per Week: 3 days    Minutes of Exercise per Session: 60 min  Stress: No Stress Concern Present (07/06/2023)   [employer] of Occupational Health - Occupational Stress Questionnaire    Feeling of Stress : Only a little  Social Connections: Socially Integrated (07/06/2023)   Social Connection and Isolation Panel [NHANES]    Frequency of Communication with Friends and Family: More than three times a week    Frequency of Social Gatherings with Friends and Family: More than three times a week    Attends Religious Services: More than 4 times per year    Active Member of [employer] or Organizations: Yes    Attends Engineer, structural: More than 4 times per year    Marital Status: Married  Catering manager Violence: Not on file    Review of Systems  Constitutional:  Positive for malaise/fatigue. Negative for chills, fever and weight loss.  Eyes:  Negative for blurred vision and double vision.  Respiratory:  Negative for shortness of breath.   Cardiovascular:  Negative for chest pain, palpitations, claudication and leg swelling.  Gastrointestinal:  Negative for abdominal pain, constipation, diarrhea, nausea and vomiting.  Genitourinary:  Negative for dysuria, frequency and urgency.  Neurological:  Positive for headaches. Negative for dizziness and focal weakness.  Psychiatric/Behavioral:  Negative for depression.         Objective    BP 124/82  (BP Location: Left Arm, Patient Position: Sitting)   Pulse 88   Temp 98 F (36.7 C) (Temporal)   Ht 5' 5.5" (1.664 m)   Wt 196 lb (88.9 kg)   SpO2 97%   BMI 32.12 kg/m   Physical Exam Constitutional:      General: She is not in acute distress.    Appearance: She is not ill-appearing.  HENT:     Mouth/Throat:     Mouth: Mucous membranes are moist.  Eyes:     Extraocular Movements: Extraocular movements intact.  Conjunctiva/sclera: Conjunctivae normal.  Cardiovascular:     Rate and Rhythm: Normal rate and regular rhythm.  Pulmonary:     Effort: Pulmonary effort is normal.     Breath sounds: Normal breath sounds.  Musculoskeletal:     Cervical back: Normal range of motion and neck supple.     Right lower leg: No edema.     Left lower leg: No edema.  Skin:    General: Skin is warm and dry.  Neurological:     General: No focal deficit present.     Mental Status: She is alert and oriented to person, place, and time.     Motor: No weakness.     Coordination: Coordination normal.     Gait: Gait normal.  Psychiatric:        Mood and Affect: Mood normal.        Behavior: Behavior normal.        Thought Content: Thought content normal.         Assessment & Plan:   Problem List Items Addressed This Visit     Migraine without aura   Relevant Medications   escitalopram (LEXAPRO) 10 MG tablet   Other Visit Diagnoses       Fatigue, unspecified type    -  Primary   Relevant Orders   CBC with Differential/Platelet (Completed)   Comprehensive metabolic panel with GFR (Completed)   Ferritin (Completed)   Folate (Completed)   Hemoglobin A1c (Completed)   Lipid panel (Completed)   TSH (Completed)   T4, free (Completed)   Vitamin B12 (Completed)   VITAMIN D  25 Hydroxy (Vit-D Deficiency, Fractures) (Completed)     Obesity (BMI 30.0-34.9)       Relevant Orders   CBC with Differential/Platelet (Completed)   Comprehensive metabolic panel with GFR (Completed)    Hemoglobin A1c (Completed)   Lipid panel (Completed)   TSH (Completed)   T4, free (Completed)      She is a pleasant 63 year old female who is new to the practice here to establish care. Migraines 5 to 6 days/month.  Typically only needs 1 rizatriptan to abort headache. Check labs to look for underlying etiology for fatigue.  No sign of sleep apnea Epworth Sleepiness Scale normal.  She will check with insurance regarding GLP-1s Continue healthy diet and exercise.  Follow up pending labs or in 4 months.    Return in about 4 months (around 11/06/2023).   Alyson Back, NP-C

## 2023-07-07 NOTE — Patient Instructions (Addendum)
 Thank you for trusting us  with your health care.  Please go downstairs for labs before you leave.  We will be in touch with your results and with recommendations.   Check with your insurance to see if Zepbound or Frederik Jansky are covered.   You can also let me know if you would like the referral to [facility].

## 2023-07-20 ENCOUNTER — Other Ambulatory Visit: Payer: Self-pay | Admitting: Obstetrics and Gynecology

## 2023-07-20 DIAGNOSIS — D369 Benign neoplasm, unspecified site: Secondary | ICD-10-CM

## 2023-07-24 ENCOUNTER — Ambulatory Visit
Admission: RE | Admit: 2023-07-24 | Discharge: 2023-07-24 | Disposition: A | Discharge status: Home / Self Care | Payer: 59 | Source: Ambulatory Visit | Attending: Obstetrics and Gynecology | Admitting: Obstetrics and Gynecology

## 2023-07-24 DIAGNOSIS — D369 Benign neoplasm, unspecified site: Secondary | ICD-10-CM

## 2023-11-06 ENCOUNTER — Ambulatory Visit: Admitting: Family Medicine
